# Patient Record
Sex: Male | Born: 1975 | Race: Black or African American | Hispanic: No | Marital: Single | Smoking: Never smoker
Health system: Southern US, Community
[De-identification: ages and names within clinical notes are randomized; demographics above are authoritative.]

## PROBLEM LIST (undated history)

## (undated) DIAGNOSIS — I10 Essential (primary) hypertension: Secondary | ICD-10-CM

## (undated) DIAGNOSIS — E119 Type 2 diabetes mellitus without complications: Secondary | ICD-10-CM

---

## 2017-04-18 ENCOUNTER — Emergency Department (HOSPITAL_COMMUNITY)
Admission: EM | Admit: 2017-04-18 | Discharge: 2017-04-18 | Disposition: A | Discharge status: Home / Self Care | Payer: Self-pay | Attending: Emergency Medicine | Admitting: Emergency Medicine

## 2017-04-18 ENCOUNTER — Encounter (HOSPITAL_COMMUNITY): Payer: Self-pay | Admitting: *Deleted

## 2017-04-18 DIAGNOSIS — M25512 Pain in left shoulder: Secondary | ICD-10-CM | POA: Insufficient documentation

## 2017-04-18 DIAGNOSIS — I10 Essential (primary) hypertension: Secondary | ICD-10-CM | POA: Insufficient documentation

## 2017-04-18 DIAGNOSIS — E119 Type 2 diabetes mellitus without complications: Secondary | ICD-10-CM | POA: Insufficient documentation

## 2017-04-18 HISTORY — DX: Essential (primary) hypertension: I10

## 2017-04-18 HISTORY — DX: Type 2 diabetes mellitus without complications: E11.9

## 2017-04-18 MED ORDER — TRAMADOL HCL 50 MG PO TABS: 50.0000 mg | ORAL_TABLET | Freq: Four times a day (QID) | ORAL | 0 refills | Status: DC | PRN

## 2017-04-18 MED ORDER — GABAPENTIN 100 MG PO CAPS: 100.0000 mg | ORAL_CAPSULE | Freq: Three times a day (TID) | ORAL | 0 refills | Status: DC

## 2017-04-18 NOTE — ED Notes (Signed)
Declined W/C at D/C and was escorted to lobby by RN. 

## 2017-04-18 NOTE — ED Provider Notes (Signed)
[facility]EMER[facility]ENCY DEPT Provider Note   CSN: [id number] Arrival date & time: 04/18/17  1618   By signing my name below, I, Clarisse Gouge, attest that this documentation has been prepared under the direction and in the presence of Raeford Razor, MD. Electronically signed, Clarisse Gouge, ED Scribe. 04/18/17. 6:16 PM.   History   Chief Complaint Chief Complaint  Patient presents with  . Shoulder Pain   The history is provided by the patient and medical records. No language interpreter was used.    Gregory Blake is a 41 y.o. male with h/o NIDDM presenting to the Emergency Department concerning 3 weeks of pain under the L shoulder blade that has gradually worsened since onset. He describes constant, throbbing pain that recently began aching radiating down to the L latissimus dorsi area, axilla and into the upper forearm. This pain is worse with certain movements and palpation of the arm/ shoulder at the affected areas. Pt states advil and ibuprofen have not provided relief at home. He adds consistent movement of the shoulder eases his pain momentarily. Pt's DM reportedly controlled with oral medications. Pt R handed. No other complaints at this time.   Past Medical History:  Diagnosis Date  . Diabetes mellitus without complication (HCC)   . Hypertension     There are no active problems to display for this patient.   History reviewed. No pertinent surgical history.     Home Medications    Prior to Admission medications   Not on File    Family History No family history on file.  Social History Social History  Substance Use Topics  . Smoking status: Never Smoker  . Smokeless tobacco: Never Used  . Alcohol use No     Allergies   Patient has no known allergies.   Review of Systems Review of Systems  Constitutional: Negative for chills and fever.  Gastrointestinal: Negative for nausea and vomiting.  Musculoskeletal: Positive for arthralgias and myalgias. Negative for  joint swelling.  Skin: Negative for color change and wound.  Neurological: Negative for weakness and numbness.  All other systems reviewed and are negative.    Physical Exam Updated Vital Signs BP (!) 135/94 (BP Location: Left Arm)   Pulse 92   Temp 98.1 F (36.7 C) (Oral)   Resp 16   Wt 210 lb (95.3 kg)   SpO2 96%   Physical Exam  Constitutional: He is oriented to person, place, and time. He appears well-developed and well-nourished.  HENT:  Head: Normocephalic.  Eyes: EOM are normal.  Neck: Normal range of motion.  Pulmonary/Chest: Effort normal.  Abdominal: He exhibits no distension.  Musculoskeletal: Normal range of motion. He exhibits tenderness. He exhibits no deformity.  TTP along L scapula, L axilla and medial upper arm. No swelling or skin changes. NVI.  Neurological: He is alert and oriented to person, place, and time.  Psychiatric: He has a normal mood and affect.  Nursing note and vitals reviewed.    ED Treatments / Results  DIAGNOSTIC STUDIES: Oxygen Saturation is 96% on RA, NL by my interpretation.    COORDINATION OF CARE: 6:13 PM-Discussed next steps with pt. Pt verbalized understanding and is agreeable with the plan. Will order/ Rx medications. Pt prepared for d/c, advised of symptomatic care at home, F/U instructions and return precautions.    Labs (all labs ordered are listed, but only abnormal results are displayed) Labs Reviewed - No data to display  EKG  EKG Interpretation None  Radiology No results found.  Procedures Procedures (including critical care time)  Medications Ordered in ED Medications - No data to display   Initial Impression / Assessment and Plan / ED Course  I have reviewed the triage vital signs and the nursing notes.  Pertinent labs & imaging results that were available during my care of the patient were reviewed by me and considered in my medical decision making (see chart for details).     41yM with  atraumatic L upper back and arm pain. Exam pretty unremarkable. No swelling. No skin lesions. NVI. Perhaps some mild TTP. Pain crosses several joints. Roughly follows c8 dermatome but stops at forearm. Neuropathic? Description doesn't clearly hit this though. NO neck or midline back pain.  I really doubt ACS, PE, vascular occlusion or other emergent process.   Final Clinical Impressions(s) / ED Diagnoses   Final diagnoses:  Acute pain of left shoulder    New Prescriptions New Prescriptions   No medications on file    I personally preformed the services scribed in my presence. The recorded information has been reviewed is accurate. Raeford Razor, MD.    Raeford Razor, MD 05/05/17 1125

## 2017-04-18 NOTE — ED Triage Notes (Signed)
Pt has been having pain in his left shoulder blade with pain in his left arm, pain increases when his arm is squeezed.  No CP or sob.  Not associated with any trauma

## 2017-04-25 ENCOUNTER — Emergency Department (HOSPITAL_COMMUNITY): Payer: Self-pay

## 2017-04-25 ENCOUNTER — Emergency Department (HOSPITAL_COMMUNITY)
Admission: EM | Admit: 2017-04-25 | Discharge: 2017-04-25 | Disposition: A | Discharge status: Home / Self Care | Payer: Self-pay | Attending: Emergency Medicine | Admitting: Emergency Medicine

## 2017-04-25 ENCOUNTER — Encounter (HOSPITAL_COMMUNITY): Payer: Self-pay | Admitting: Emergency Medicine

## 2017-04-25 DIAGNOSIS — Z7984 Long term (current) use of oral hypoglycemic drugs: Secondary | ICD-10-CM | POA: Insufficient documentation

## 2017-04-25 DIAGNOSIS — E119 Type 2 diabetes mellitus without complications: Secondary | ICD-10-CM | POA: Insufficient documentation

## 2017-04-25 DIAGNOSIS — Z79899 Other long term (current) drug therapy: Secondary | ICD-10-CM | POA: Insufficient documentation

## 2017-04-25 DIAGNOSIS — I10 Essential (primary) hypertension: Secondary | ICD-10-CM | POA: Insufficient documentation

## 2017-04-25 DIAGNOSIS — M549 Dorsalgia, unspecified: Secondary | ICD-10-CM | POA: Insufficient documentation

## 2017-04-25 DIAGNOSIS — M79602 Pain in left arm: Secondary | ICD-10-CM

## 2017-04-25 DIAGNOSIS — K0889 Other specified disorders of teeth and supporting structures: Secondary | ICD-10-CM | POA: Insufficient documentation

## 2017-04-25 DIAGNOSIS — M5413 Radiculopathy, cervicothoracic region: Secondary | ICD-10-CM | POA: Insufficient documentation

## 2017-04-25 LAB — CBC WITH DIFFERENTIAL/PLATELET
BASOS ABS: 0.1 10*3/uL (ref 0.0–0.1)
Basophils Relative: 1 %
EOS ABS: 0.3 10*3/uL (ref 0.0–0.7)
EOS PCT: 3 %
HCT: 46.3 % (ref 39.0–52.0)
HEMOGLOBIN: 15.8 g/dL (ref 13.0–17.0)
LYMPHS ABS: 2.6 10*3/uL (ref 0.7–4.0)
LYMPHS PCT: 26 %
MCH: 29.7 pg (ref 26.0–34.0)
MCHC: 34.1 g/dL (ref 30.0–36.0)
MCV: 87 fL (ref 78.0–100.0)
Monocytes Absolute: 1 10*3/uL (ref 0.1–1.0)
Monocytes Relative: 10 %
NEUTROS PCT: 60 %
Neutro Abs: 5.9 10*3/uL (ref 1.7–7.7)
PLATELETS: 236 10*3/uL (ref 150–400)
RBC: 5.32 MIL/uL (ref 4.22–5.81)
RDW: 13 % (ref 11.5–15.5)
WBC: 9.9 10*3/uL (ref 4.0–10.5)

## 2017-04-25 LAB — BASIC METABOLIC PANEL
Anion gap: 8 (ref 5–15)
BUN: 13 mg/dL (ref 6–20)
CHLORIDE: 101 mmol/L (ref 101–111)
CO2: 24 mmol/L (ref 22–32)
Calcium: 9.1 mg/dL (ref 8.9–10.3)
Creatinine, Ser: 0.99 mg/dL (ref 0.61–1.24)
Glucose, Bld: 266 mg/dL — ABNORMAL HIGH (ref 65–99)
POTASSIUM: 4 mmol/L (ref 3.5–5.1)
SODIUM: 133 mmol/L — AB (ref 135–145)

## 2017-04-25 LAB — CBG MONITORING, ED: GLUCOSE-CAPILLARY: 272 mg/dL — AB (ref 65–99)

## 2017-04-25 LAB — I-STAT TROPONIN, ED: TROPONIN I, POC: 0 ng/mL (ref 0.00–0.08)

## 2017-04-25 MED ORDER — NAPROXEN 500 MG PO TABS: 500.0000 mg | ORAL_TABLET | Freq: Two times a day (BID) | ORAL | 0 refills | Status: DC

## 2017-04-25 MED ORDER — OXYCODONE-ACETAMINOPHEN 5-325 MG PO TABS
1.0000 | ORAL_TABLET | Freq: Once | ORAL | Status: AC
  Administered 2017-04-25: 1 via ORAL
  Filled 2017-04-25: qty 1

## 2017-04-25 MED ORDER — CYCLOBENZAPRINE HCL 10 MG PO TABS: 10.0000 mg | ORAL_TABLET | Freq: Two times a day (BID) | ORAL | 0 refills | Status: DC | PRN

## 2017-04-25 MED ORDER — DOXYCYCLINE HYCLATE 100 MG PO CAPS: 100.0000 mg | ORAL_CAPSULE | Freq: Two times a day (BID) | ORAL | 0 refills | Status: AC

## 2017-04-25 NOTE — ED Provider Notes (Signed)
[facility] Provider Note   CSN: [id number] Arrival date & time: 04/25/17  0754     History   Chief Complaint Chief Complaint  Patient presents with  . Arm Pain    HPI Gregory Blake is a 41 y.o. male.  HPI  Patient, with a past medical history of diabetes, presents to ED for evaluation of 2 complaints. His first complaint is constant, throbbing left shoulder pain radiating down his arm for the past month. He was seen here one week ago and was told that it was nerve pain, however despite the use of gabapentin and tramadol he continues to have pain. He has also been taking aspirin. He reports pain worse with movement. He denies any injuries or falls.he is unsure if this related to a knot that he feels under his left armpit. He does have a history of abscesses in the past. His next complaint is left-sided tooth pain. He states he does have a history of broken tooth on that side and would like some antibiotics to prevent any infection. He has not seen a dentist recently. He denies any fever, vomiting, chest pain, shortness of breath, hemoptysis, prior history of MI, DVT, PE, trouble opening mouth, bleeding or drainage from site, weakness, numbness, headache. Patient states that he does take oral medications for diabetes.  Past Medical History:  Diagnosis Date  . Diabetes mellitus without complication (HCC)   . Hypertension     There are no active problems to display for this patient.   No past surgical history on file.     Home Medications    Prior to Admission medications   Medication Sig Start Date End Date Taking? Authorizing Provider  cyclobenzaprine (FLEXERIL) 10 MG tablet Take 1 tablet (10 mg total) by mouth 2 (two) times daily as needed for muscle spasms. 04/25/17   Xylah Early, PA-C  doxycycline (VIBRAMYCIN) 100 MG capsule Take 1 capsule (100 mg total) by mouth 2 (two) times daily. 04/25/17 05/02/17  Drishti Pepperman, PA-C  gabapentin (NEURONTIN) 100 MG capsule Take  1 capsule (100 mg total) by mouth 3 (three) times daily. 04/18/17   Raeford Razor, MD  naproxen (NAPROSYN) 500 MG tablet Take 1 tablet (500 mg total) by mouth 2 (two) times daily. 04/25/17   Larri Yehle, PA-C  traMADol (ULTRAM) 50 MG tablet Take 1 tablet (50 mg total) by mouth every 6 (six) hours as needed. 04/18/17   Raeford Razor, MD    Family History No family history on file.  Social History Social History  Substance Use Topics  . Smoking status: Never Smoker  . Smokeless tobacco: Never Used  . Alcohol use No     Allergies   Patient has no known allergies.   Review of Systems Review of Systems  Constitutional: Negative for chills and fever.  Respiratory: Negative for cough and shortness of breath.   Cardiovascular: Negative for chest pain and palpitations.  Gastrointestinal: Negative for nausea and vomiting.  Musculoskeletal: Positive for arthralgias, back pain and myalgias. Negative for gait problem and joint swelling.  Skin: Negative for color change and wound.  Neurological: Negative for weakness, numbness and headaches.     Physical Exam Updated Vital Signs BP (!) 143/89 (BP Location: Left Arm)   Pulse 67   Temp 98.8 F (37.1 C) (Oral)   Resp 18   SpO2 99%   Physical Exam  Constitutional: He appears well-developed and well-nourished. No distress.  HENT:  Head: Normocephalic and atraumatic.  Mouth/Throat:    TTP  over indicated gumline. Chipped teeth in area. Overall poor dentition. No facial, neck or cheek swelling noted. No pooling of secretions or trismus.  Normal voice noted with no difficulty swallowing or breathing.  Eyes: Conjunctivae and EOM are normal. No scleral icterus.  Neck: Normal range of motion.  Cardiovascular: Normal rate, regular rhythm and normal heart sounds.   Pulmonary/Chest: Effort normal and breath sounds normal. No respiratory distress.  Musculoskeletal: Normal range of motion. He exhibits tenderness. He exhibits no edema or  deformity.       Arms: Reproducible tenderness to palpation in the indicated area. Strength 5/5 in upper extremities. Equal grip strength bilaterally. Sensation intact to light touch. 2+ radial pulse.  Neurological: He is alert.  Skin: No rash noted. He is not diaphoretic.  2 cm palpable induration under left armpit with no surrounding cellulitis, streaking, color or temperature change.  Psychiatric: He has a normal mood and affect.  Nursing note and vitals reviewed.    ED Treatments / Results  Labs (all labs ordered are listed, but only abnormal results are displayed) Labs Reviewed  BASIC METABOLIC PANEL - Abnormal; Notable for the following:       Result Value   Sodium 133 (*)    Glucose, Bld 266 (*)    All other components within normal limits  CBG MONITORING, ED - Abnormal; Notable for the following:    Glucose-Capillary 272 (*)    All other components within normal limits  CBC WITH DIFFERENTIAL/PLATELET  I-STAT TROPONIN, ED    EKG  EKG Interpretation  Date/Time:  Sunday April 25 2017 09:32:26 EDT Ventricular Rate:  94 PR Interval:  180 QRS Duration: 98 QT Interval:  334 QTC Calculation: 417 R Axis:   68 Text Interpretation:  Normal sinus rhythm Possible Left atrial enlargement Borderline ECG Slight peaking of T waves Otherwise no ischemic changes Confirmed by Shaune Pollack [phone]) on 04/25/2017 10:06:39 AM Also confirmed by Shaune Pollack [phone]), editor Madalyn Rob [phone])  on 04/25/2017 10:17:38 AM       Radiology Dg Chest 2 View  Result Date: 04/25/2017 CLINICAL DATA:  Left shoulder pain. EXAM: CHEST  2 VIEW COMPARISON:  None. FINDINGS: The heart size and mediastinal contours are within normal limits. Both lungs are clear. The visualized skeletal structures are unremarkable. IMPRESSION: No active cardiopulmonary disease. Electronically Signed   By: Kennith Center M.D.   On: 04/25/2017 09:59   Dg Shoulder Left  Result Date: 04/25/2017 CLINICAL DATA:   Left shoulder pain EXAM: LEFT SHOULDER - 2+ VIEW COMPARISON:  None. FINDINGS: There is no evidence of fracture or dislocation. There is no evidence of arthropathy or other focal bone abnormality. Soft tissues are unremarkable. IMPRESSION: Negative. Electronically Signed   By: Kennith Center M.D.   On: 04/25/2017 09:54    Procedures Procedures (including critical care time)  Medications Ordered in ED Medications  oxyCODONE-acetaminophen (PERCOCET/ROXICET) 5-325 MG per tablet 1 tablet (1 tablet Oral Given 04/25/17 1031)     Initial Impression / Assessment and Plan / ED Course  I have reviewed the triage vital signs and the nursing notes.  Pertinent labs & imaging results that were available during my care of the patient were reviewed by me and considered in my medical decision making (see chart for details).     Patient presents to ED for evaluation of left back/shoulder pain radiating to arm. He was seen one week ago and reports no alleviation in symptoms with tramadol and gabapentin after being told he had  possible nerve pain. On physical exam there is tenderness to palpation on the left side of the shoulder blade. Area appears neurovascularly intact. Is equal grip strength bilaterally. He denies any injuries or trauma. Poor dentition overall but chipped tooth and area of dental pain. No trismus, drooling, neck pain. Low suspicion for Ludwig angina being the cause of his tooth pain. There is a palpable area of induration under left armpit. No fluctuance noted, no overlying cellulitis, streaking, color or temperature change and no drainage indicated at this time. He does have a history of recurrent abscesses.  Will obtain EKG, basic labs, chest x-ray and shoulder x-ray due to no improvement in pain in one week despite medication use, in order to rule out cardiac cause of this left-sided pain.  X-ray of shoulder and chest returned as negative. EKG showed no ischemic changes but did show mildly  peaked T waves. CBG 272. Troponin negative 1. CBC, BMP unremarkable, with a normal potassium. Will give patient a muscle relaxer, advised anti-inflammatories, heat therapy with instructions. Symptoms could be due to nerve pain but also could be a muscle strain. We'll refer to ortho for further evaluation. We'll give doxycycline to cover for abscess under her armpit and tooth pain considering that patient does have a history of abscesses in the past.Patient appears stable for discharge at this time. Strict return precautions given.  Patient discussed with Dr. Erma Heritage.  Final Clinical Impressions(s) / ED Diagnoses   Final diagnoses:  Radiculopathy of cervicothoracic region  Left arm pain  Pain, dental    New Prescriptions New Prescriptions   CYCLOBENZAPRINE (FLEXERIL) 10 MG TABLET    Take 1 tablet (10 mg total) by mouth 2 (two) times daily as needed for muscle spasms.   DOXYCYCLINE (VIBRAMYCIN) 100 MG CAPSULE    Take 1 capsule (100 mg total) by mouth 2 (two) times daily.   NAPROXEN (NAPROSYN) 500 MG TABLET    Take 1 tablet (500 mg total) by mouth 2 (two) times daily.     Dietrich Pates, PA-C 04/25/17 1125    Shaune Pollack, MD 04/26/17 7846    Shaune Pollack, MD 04/26/17 [phone]

## 2017-04-25 NOTE — ED Triage Notes (Addendum)
Pt reports left shoulder pain radiating into left arm, sharp shooting pain. Pt seen for this 8/19. Pt states pain is shooting and was told it was nerve pain. Pain is reproducible. Pt states he feels a knot in his arm pit. He has been taking asprin for the pain but pain is getting worse. Also has tooth pain.

## 2017-04-25 NOTE — Discharge Instructions (Signed)
Please read attached information regarding your condition, dental resource guide and neuropathic pain. Take doxycycline twice daily for 1 week. Wear sling as needed. Take Flexeril and naproxen as needed for pain and inflammation. Follow-up with orthopedist list below for further evaluation. Follow up with dentist using dental resource guide and information given below. Return to ED for worsening pain, trouble breathing, trouble swallowing, chest pain, coughing up blood, leg swelling, numbness.

## 2017-04-25 NOTE — ED Notes (Signed)
Pt reports "no relief yet".

## 2017-04-25 NOTE — ED Notes (Signed)
EKG shown to Dr. Erma Heritage.

## 2017-04-25 NOTE — ED Notes (Signed)
Returned from xray

## 2017-04-25 NOTE — ED Notes (Signed)
Pt appears very uncomfortable. Requesting something for pain.

## 2017-04-25 NOTE — ED Notes (Signed)
Pt here for ongoing left arm pain. Was seen here 9/19, given "a nerve ill" and Tramadol. Pt states "ya'll were just guessing last time, I want to know what it is this time." Pain is constant, radiates down left arm and into axilla. Denies numbness, tingling or weakness of left arm. Now also reports left upper dental pain. Reports he sometimes can get relief by raising his left arm above his head. Sx began 1 month ago. No known injury.

## 2017-04-25 NOTE — ED Notes (Signed)
Patient transported to X-ray 

## 2017-05-03 ENCOUNTER — Encounter (HOSPITAL_COMMUNITY): Payer: Self-pay | Admitting: Emergency Medicine

## 2017-05-03 ENCOUNTER — Emergency Department (HOSPITAL_COMMUNITY): Payer: Self-pay

## 2017-05-03 ENCOUNTER — Emergency Department (HOSPITAL_COMMUNITY)
Admission: EM | Admit: 2017-05-03 | Discharge: 2017-05-03 | Disposition: A | Discharge status: Home / Self Care | Payer: Self-pay | Attending: Emergency Medicine | Admitting: Emergency Medicine

## 2017-05-03 DIAGNOSIS — M25512 Pain in left shoulder: Secondary | ICD-10-CM | POA: Insufficient documentation

## 2017-05-03 DIAGNOSIS — M79602 Pain in left arm: Secondary | ICD-10-CM | POA: Insufficient documentation

## 2017-05-03 DIAGNOSIS — Z79899 Other long term (current) drug therapy: Secondary | ICD-10-CM | POA: Insufficient documentation

## 2017-05-03 DIAGNOSIS — I10 Essential (primary) hypertension: Secondary | ICD-10-CM | POA: Insufficient documentation

## 2017-05-03 DIAGNOSIS — M5412 Radiculopathy, cervical region: Secondary | ICD-10-CM | POA: Insufficient documentation

## 2017-05-03 DIAGNOSIS — E119 Type 2 diabetes mellitus without complications: Secondary | ICD-10-CM | POA: Insufficient documentation

## 2017-05-03 LAB — BASIC METABOLIC PANEL
ANION GAP: 9 (ref 5–15)
BUN: 19 mg/dL (ref 6–20)
CO2: 24 mmol/L (ref 22–32)
Calcium: 9.4 mg/dL (ref 8.9–10.3)
Chloride: 103 mmol/L (ref 101–111)
Creatinine, Ser: 1.01 mg/dL (ref 0.61–1.24)
Glucose, Bld: 279 mg/dL — ABNORMAL HIGH (ref 65–99)
POTASSIUM: 3.7 mmol/L (ref 3.5–5.1)
SODIUM: 136 mmol/L (ref 135–145)

## 2017-05-03 LAB — CBC
HEMATOCRIT: 47.9 % (ref 39.0–52.0)
HEMOGLOBIN: 16.6 g/dL (ref 13.0–17.0)
MCH: 29.5 pg (ref 26.0–34.0)
MCHC: 34.7 g/dL (ref 30.0–36.0)
MCV: 85.2 fL (ref 78.0–100.0)
Platelets: 247 10*3/uL (ref 150–400)
RBC: 5.62 MIL/uL (ref 4.22–5.81)
RDW: 12.8 % (ref 11.5–15.5)
WBC: 10.4 10*3/uL (ref 4.0–10.5)

## 2017-05-03 LAB — I-STAT TROPONIN, ED: Troponin i, poc: 0 ng/mL (ref 0.00–0.08)

## 2017-05-03 MED ORDER — HYDROCODONE-ACETAMINOPHEN 5-325 MG PO TABS: 1.0000 | ORAL_TABLET | ORAL | 0 refills | Status: DC | PRN

## 2017-05-03 MED ORDER — GLIPIZIDE 5 MG PO TABS: 5.0000 mg | ORAL_TABLET | Freq: Every day | ORAL | 3 refills | Status: DC

## 2017-05-03 MED ORDER — METFORMIN HCL 500 MG PO TABS: 500.0000 mg | ORAL_TABLET | Freq: Two times a day (BID) | ORAL | 0 refills | Status: DC

## 2017-05-03 NOTE — ED Provider Notes (Signed)
[facility] Provider Note   CSN: [id number] Arrival date & time: 05/03/17  0343     History   Chief Complaint Chief Complaint  Patient presents with  . Chest Pain  . Neck Pain    HPI Gregory Blake is a 41 y.o. male.  Patient presents to the ER for evaluation of persistent left arm and shoulder pain, now with chest pain. Patient reports that he has been experiencing pain in the area of the left neck, left shoulder and arm for more than a month. He has been seen in the ER twice for this. He is now experiencing pain in the upper chest as well. He is not short of breath. All of the pain worsens with movement of the neck. He has not had any weakness of the upper extremities. He denies any trauma. He has been provided multiple medications in the ER without relief.      Past Medical History:  Diagnosis Date  . Diabetes mellitus without complication (HCC)   . Hypertension     There are no active problems to display for this patient.   History reviewed. No pertinent surgical history.     Home Medications    Prior to Admission medications   Medication Sig Start Date End Date Taking? Authorizing Provider  cyclobenzaprine (FLEXERIL) 10 MG tablet Take 1 tablet (10 mg total) by mouth 2 (two) times daily as needed for muscle spasms. 04/25/17   Khatri, Hina, PA-C  gabapentin (NEURONTIN) 100 MG capsule Take 1 capsule (100 mg total) by mouth 3 (three) times daily. 04/18/17   Raeford Razor, MD  naproxen (NAPROSYN) 500 MG tablet Take 1 tablet (500 mg total) by mouth 2 (two) times daily. 04/25/17   Khatri, Hina, PA-C  traMADol (ULTRAM) 50 MG tablet Take 1 tablet (50 mg total) by mouth every 6 (six) hours as needed. 04/18/17   Raeford Razor, MD    Family History No family history on file.  Social History Social History  Substance Use Topics  . Smoking status: Never Smoker  . Smokeless tobacco: Never Used  . Alcohol use No     Allergies   Iodine   Review of  Systems Review of Systems  Cardiovascular: Positive for chest pain.  Musculoskeletal: Positive for neck pain.  Neurological: Negative for weakness and numbness.  All other systems reviewed and are negative.    Physical Exam Updated Vital Signs BP (!) 132/91   Pulse (!) 107   Temp 98.4 F (36.9 C) (Oral)   Resp 15   Ht 6\' 1"  (1.854 m)   Wt 95.3 kg (210 lb)   SpO2 99%   BMI 27.71 kg/m   Physical Exam  Constitutional: He is oriented to person, place, and time. He appears well-developed and well-nourished. No distress.  HENT:  Head: Normocephalic and atraumatic.  Right Ear: Hearing normal.  Left Ear: Hearing normal.  Nose: Nose normal.  Mouth/Throat: Oropharynx is clear and moist and mucous membranes are normal.  Eyes: Pupils are equal, round, and reactive to light. Conjunctivae and EOM are normal.  Neck: Normal range of motion. Neck supple. Muscular tenderness present.  Increased pain with turning head side-to-side  Cardiovascular: Regular rhythm, S1 normal and S2 normal.  Exam reveals no gallop and no friction rub.   No murmur heard. Pulmonary/Chest: Effort normal and breath sounds normal. No respiratory distress. He exhibits no tenderness.  Abdominal: Soft. Normal appearance and bowel sounds are normal. There is no hepatosplenomegaly. There is no tenderness. There is  no rebound, no guarding, no tenderness at McBurney's point and negative Murphy's sign. No hernia.  Musculoskeletal: Normal range of motion.  Neurological: He is alert and oriented to person, place, and time. He has normal strength. No cranial nerve deficit or sensory deficit. Coordination normal. GCS eye subscore is 4. GCS verbal subscore is 5. GCS motor subscore is 6.  Skin: Skin is warm, dry and intact. No rash noted. No cyanosis.  Psychiatric: He has a normal mood and affect. His speech is normal and behavior is normal. Thought content normal.  Nursing note and vitals reviewed.    ED Treatments / Results   Labs (all labs ordered are listed, but only abnormal results are displayed) Labs Reviewed  BASIC METABOLIC PANEL - Abnormal; Notable for the following:       Result Value   Glucose, Bld 279 (*)    All other components within normal limits  CBC  I-STAT TROPONIN, ED  CBG MONITORING, ED    EKG  EKG Interpretation  Date/Time:  Monday May 03 2017 03:58:05 EDT Ventricular Rate:  106 PR Interval:  180 QRS Duration: 92 QT Interval:  326 QTC Calculation: 433 R Axis:   68 Text Interpretation:  Sinus tachycardia Possible Left atrial enlargement Septal infarct , age undetermined Abnormal ECG No significant change since last tracing Confirmed by Gilda Crease [phone]) on 05/03/2017 5:55:27 AM       Radiology Dg Chest 2 View  Result Date: 05/03/2017 CLINICAL DATA:  Intermittent chest pain radiating to the shoulder. EXAM: CHEST  2 VIEW COMPARISON:  04/25/2017 FINDINGS: The lungs are clear. The pulmonary vasculature is normal. Heart size is normal. Hilar and mediastinal contours are unremarkable. There is no pleural effusion. IMPRESSION: No active cardiopulmonary disease. Electronically Signed   By: Ellery Plunk M.D.   On: 05/03/2017 04:25   Ct Cervical Spine Wo Contrast  Result Date: 05/03/2017 CLINICAL DATA:  41 y/o M; neck pain radiating to the left shoulder and upper extremity. Initial exam. EXAM: CT CERVICAL SPINE WITHOUT CONTRAST TECHNIQUE: Multidetector CT imaging of the cervical spine was performed without intravenous contrast. Multiplanar CT image reconstructions were also generated. COMPARISON:  None. FINDINGS: Alignment: Mild reversal of cervical curvature with apex at C5. No listhesis. Skull base and vertebrae: No acute fracture. No primary bone lesion or focal pathologic process. Soft tissues and spinal canal: No prevertebral fluid or swelling. No visible canal hematoma. Disc levels: Mild cervical spondylosis with multilevel discogenic degenerative changes with mild  loss of disc space height at the C5-6 and C6-7 levels. Small anterior marginal osteophytes are present at C3-C6. Uncovertebral hypertrophy encroaches on the neural foramen bilaterally from C3-4 through C6-7 in greatest at C6-7. C4-5 small right central disc protrusion and C5-6 left central disc protrusions efface the ventral thecal sac and may contact the anterior cord. Osteophytosis of the anterior C1-2 articulation. Upper chest: Negative. Other: None. IMPRESSION: 1. Mild reversal of cervical curvature. No listhesis. No acute fracture. 2. 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 3. Uncovertebral hypertrophy encroaches on the neural foramen bilaterally at C3-4 through C6-7 greatest at the C6-7 level. 4. Small central disc protrusions at C4-5 and C5-6 efface the ventral thecal sac and may contact the anterior cord. Electronically Signed   By: Mitzi Hansen M.D.   On: 05/03/2017 06:45    Procedures Procedures (including critical care time)  Medications Ordered in ED Medications - No data to display   Initial Impression / Assessment and Plan / ED Course  I have reviewed the triage vital signs and the nursing notes.  Pertinent labs & imaging results that were available during my care of the patient were reviewed by me and considered in my medical decision making (see chart for details).     Patient presents to the ER for evaluation of continued pain. He has been seen 2 previous times for pain in the area of the left neck, left shoulder and arm. Pain had been radiating to the left shoulder blade previously. He now appears to be experiencing pain radiating to the upper chest. Pain is reproducible with movement. EKG does not suggest ischemia or infarct. Troponin negative.  CT neck performed. There are bulging disks and degenerative changes that would explain his symptoms. Can refer to neurosurgery for outpatient evaluation to determine if the could benefit  from surgical intervention. Will treat with analgesia. Unfortunately, patient is a diabetic and does have some slightly elevated blood sugars, will hold steroids at this time.  Final Clinical Impressions(s) / ED Diagnoses   Final diagnoses:  Cervical radiculopathy    New Prescriptions New Prescriptions   No medications on file     Gilda CreasePollina, Ned Kakar J, MD 05/03/17 [phone]

## 2017-05-03 NOTE — ED Triage Notes (Signed)
Reports chest pain that has been on and off for over a week and neck pain that goes down into left shoulder that has been going on for a month and a half.  Unaware of any injury.

## 2017-05-04 LAB — CBG MONITORING, ED: GLUCOSE-CAPILLARY: 196 mg/dL — AB (ref 65–99)

## 2017-06-19 ENCOUNTER — Encounter (HOSPITAL_COMMUNITY): Payer: Self-pay | Admitting: Emergency Medicine

## 2017-06-19 ENCOUNTER — Ambulatory Visit (HOSPITAL_COMMUNITY)
Admission: EM | Admit: 2017-06-19 | Discharge: 2017-06-19 | Disposition: A | Discharge status: Home / Self Care | Payer: Self-pay | Attending: Family Medicine | Admitting: Family Medicine

## 2017-06-19 DIAGNOSIS — R739 Hyperglycemia, unspecified: Secondary | ICD-10-CM

## 2017-06-19 LAB — GLUCOSE, CAPILLARY: GLUCOSE-CAPILLARY: 420 mg/dL — AB (ref 65–99)

## 2017-06-19 MED ORDER — GLIPIZIDE 5 MG PO TABS: 5.0000 mg | ORAL_TABLET | Freq: Every day | ORAL | 3 refills | Status: DC

## 2017-06-19 MED ORDER — METFORMIN HCL 500 MG PO TABS: 500.0000 mg | ORAL_TABLET | Freq: Two times a day (BID) | ORAL | 3 refills | Status: DC

## 2017-06-19 NOTE — ED Triage Notes (Signed)
Patient says he has been out of medicines for 2 weeks and has no pcp.  Patient says he has had headaches and frequent urination for 2 daysand concerned for what readings are at this time.

## 2017-06-19 NOTE — ED Provider Notes (Signed)
  [facility]   [id number] 06/19/17 Arrival Time: 1219  ASSESSMENT & PLAN:  1. Hyperglycemia     Meds ordered this encounter  Medications  . metFORMIN (GLUCOPHAGE) 500 MG tablet    Sig: Take 1 tablet (500 mg total) by mouth 2 (two) times daily with a meal.    Dispense:  60 tablet    Refill:  3  . glipiZIDE (GLUCOTROL) 5 MG tablet    Sig: Take 1 tablet (5 mg total) by mouth daily before breakfast.    Dispense:  30 tablet    Refill:  3   Will resume above medications today. Plans to est with PCP; information given. May f/u here in 48-72 hours for repeat BS. Reviewed expectations re: course of current medical issues. Questions answered. Outlined signs and symptoms indicating need for more acute intervention. Patient verbalized understanding. After Visit Summary given.   SUBJECTIVE:  Gregory Blake is a 41 y.o. male who presents with complaint of "high blood sugar." Has been out of diabetic medications for approximately 2 weeks. Reports occasional headaches (none currently). No visual changes. Does have frequent urination. Ambulatory without problem. No abdominal pain.  ROS: As per HPI.   OBJECTIVE:  Vitals:   06/19/17 1324  BP: 133/83  Pulse: 83  Resp: 18  Temp: 98.2 F (36.8 C)  TempSrc: Oral  SpO2: 97%    General appearance: alert; no distress Eyes: PERRLA; EOMI; conjunctiva normal HENT: normocephalic; atraumatic; oral mucosa normal Neck: supple Lungs: clear to auscultation bilaterally Heart: regular rate and rhythm Abdomen: soft, non-tender; bowel sounds normal Back: no CVA tenderness Extremities: no cyanosis or edema; symmetrical with no gross deformities Skin: warm and dry Neurologic: normal gait; normal symmetric reflexes Psychological: alert and cooperative; normal mood and affect  Labs: Results for orders placed or performed during the hospital encounter of 06/19/17  Glucose, capillary  Result Value Ref Range   Glucose-Capillary 420 (H)  65 - 99 mg/dL   Labs Reviewed  GLUCOSE, CAPILLARY - Abnormal; Notable for the following:       Result Value   Glucose-Capillary 420 (*)    All other components within normal limits    Imaging: No results found.  Allergies  Allergen Reactions  . Iodine Anaphylaxis    Past Medical History:  Diagnosis Date  . Diabetes mellitus without complication (HCC)   . Hypertension    Social History   Social History  . Marital status: Single    Spouse name: N/A  . Number of children: N/A  . Years of education: N/A   Occupational History  . Not on file.   Social History Main Topics  . Smoking status: Never Smoker  . Smokeless tobacco: Never Used  . Alcohol use No  . Drug use: No  . Sexual activity: Not on file   Other Topics Concern  . Not on file   Social History Narrative  . No narrative on file   Family History  Problem Relation Age of Onset  . Diabetes Mother   . Hypertension Mother       Mardella LaymanHagler, Kanoelani Dobies, MD 06/19/17 [phone]

## 2017-09-13 ENCOUNTER — Ambulatory Visit (HOSPITAL_COMMUNITY)
Admission: EM | Admit: 2017-09-13 | Discharge: 2017-09-13 | Disposition: A | Discharge status: Home / Self Care | Payer: Self-pay | Attending: Family Medicine | Admitting: Family Medicine

## 2017-09-13 ENCOUNTER — Encounter (HOSPITAL_COMMUNITY): Payer: Self-pay | Admitting: Emergency Medicine

## 2017-09-13 ENCOUNTER — Other Ambulatory Visit: Payer: Self-pay

## 2017-09-13 DIAGNOSIS — M542 Cervicalgia: Secondary | ICD-10-CM

## 2017-09-13 DIAGNOSIS — IMO0001 Reserved for inherently not codable concepts without codable children: Secondary | ICD-10-CM

## 2017-09-13 DIAGNOSIS — E1165 Type 2 diabetes mellitus with hyperglycemia: Secondary | ICD-10-CM

## 2017-09-13 DIAGNOSIS — I10 Essential (primary) hypertension: Secondary | ICD-10-CM

## 2017-09-13 LAB — GLUCOSE, CAPILLARY: Glucose-Capillary: 413 mg/dL — ABNORMAL HIGH (ref 65–99)

## 2017-09-13 MED ORDER — METFORMIN HCL 500 MG PO TABS: 500.0000 mg | ORAL_TABLET | Freq: Two times a day (BID) | ORAL | 3 refills | Status: DC

## 2017-09-13 MED ORDER — GABAPENTIN 100 MG PO CAPS: 100.0000 mg | ORAL_CAPSULE | Freq: Three times a day (TID) | ORAL | 2 refills | Status: DC

## 2017-09-13 MED ORDER — GLIPIZIDE 5 MG PO TABS: 5.0000 mg | ORAL_TABLET | Freq: Every day | ORAL | 3 refills | Status: DC

## 2017-09-13 NOTE — ED Triage Notes (Signed)
Pt needs refills of his diabetes medicines. Metformin and glipizide. Pt denies symptoms at this time. Last took his meds three days ago.

## 2017-09-13 NOTE — ED Provider Notes (Addendum)
[facility]   [id number] 09/13/17 Arrival Time: 1455   SUBJECTIVE:  Gregory Blake is a 42 y.o. male who presents to the urgent care with complaint of needs refills of his diabetes medicines. Metformin and glipizide. Pt denies symptoms at this time. Last took his meds three days ago.  Patient has not been taking his antihypertensives.  He smoked a cigarette just before arrival.  Also wants pain medicine for his neck where he has chronic neck problems.  He states aspirin and tramadol don't help.  Not exercising.  No primary care doctor.  Unemployed.  Not checking sugars.   Past Medical History:  Diagnosis Date  . Diabetes mellitus without complication (HCC)   . Hypertension    Family History  Problem Relation Age of Onset  . Diabetes Mother   . Hypertension Mother    Social History   Socioeconomic History  . Marital status: Single    Spouse name: Not on file  . Number of children: Not on file  . Years of education: Not on file  . Highest education level: Not on file  Social Needs  . Financial resource strain: Not on file  . Food insecurity - worry: Not on file  . Food insecurity - inability: Not on file  . Transportation needs - medical: Not on file  . Transportation needs - non-medical: Not on file  Occupational History  . Not on file  Tobacco Use  . Smoking status: Never Smoker  . Smokeless tobacco: Never Used  Substance and Sexual Activity  . Alcohol use: No  . Drug use: No  . Sexual activity: Not on file  Other Topics Concern  . Not on file  Social History Narrative  . Not on file   No outpatient medications have been marked as taking for the 09/13/17 encounter [facility](Hospital Encounter).   Allergies  Allergen Reactions  . Iodine Anaphylaxis      ROS: As per HPI, remainder of ROS negative.   OBJECTIVE:   Vitals:   09/13/17 1525  BP: (!) 148/97  Pulse: 91  Resp: 18  Temp: 98.3 F (36.8 C)  SpO2: 100%     General appearance: alert;  no distress Eyes: PERRL; EOMI; conjunctiva normal HENT: normocephalic; atraumatic;  nasal mucosa normal; oral mucosa normal Neck: supple; full ROM Lungs: clear to auscultation bilaterally Heart: regular rate and rhythm Back: no CVA tenderness Extremities: no cyanosis or edema; symmetrical with no gross deformities Skin: warm and dry; feet unremarkable Neurologic: normal gait; grossly normal Psychological: alert and cooperative; normal mood and affect      Labs:  Results for orders placed or performed during the hospital encounter of 09/13/17  Glucose, capillary  Result Value Ref Range   Glucose-Capillary 413 (H) 65 - 99 mg/dL    Labs Reviewed  GLUCOSE, CAPILLARY - Abnormal; Notable for the following components:      Result Value   Glucose-Capillary 413 (*)    All other components within normal limits    No results found.     ASSESSMENT & PLAN:  1. Uncontrolled diabetes mellitus type 2 without complications (HCC)   2. Essential hypertension   3. Neck pain     Meds ordered this encounter  Medications  . gabapentin (NEURONTIN) 100 MG capsule    Sig: Take 1 capsule (100 mg total) by mouth 3 (three) times daily.    Dispense:  90 capsule    Refill:  2  . glipiZIDE (GLUCOTROL) 5 MG tablet  Sig: Take 1 tablet (5 mg total) by mouth daily before breakfast.    Dispense:  30 tablet    Refill:  3  . metFORMIN (GLUCOPHAGE) 500 MG tablet    Sig: Take 1 tablet (500 mg total) by mouth 2 (two) times daily with a meal.    Dispense:  60 tablet    Refill:  3  You need to find a primary care doctor to help you manage your diabetes and high blood pressure.  As we discussed, it is very important to stop smoking to protect your blood vessels that supply your brain, your heart, and your other organs.  Take the gabapentin for the neck pain.  Your blood sugar today is very high.  This can lead to kidney, pancreas, and blood vessel injury.  Restart your exercise program.  Our  social worker Seward Grater has helped you get an orange card and find a primary care doctor by calling the help number in this discharge packet.   Reviewed expectations re: course of current medical issues. Questions answered. Outlined signs and symptoms indicating need for more acute intervention. Patient verbalized understanding. After Visit Summary given.    Procedures:      Elvina Sidle, MD 09/13/17 1637    Elvina Sidle, MD 09/13/17 [phone]

## 2017-09-13 NOTE — Discharge Instructions (Signed)
You need to find a primary care doctor to help you manage your diabetes and high blood pressure.  As we discussed, it is very important to stop smoking to protect your blood vessels that supply your brain, your heart, and your other organs.  Take the gabapentin for the neck pain.  Your blood sugar today is very high.  This can lead to kidney, pancreas, and blood vessel injury.  Restart your exercise program.  Our social worker Seward GraterMaggie has helped you get an orange card and find a primary care doctor by calling the help number in this discharge packet.

## 2017-09-21 ENCOUNTER — Encounter (HOSPITAL_COMMUNITY): Payer: Self-pay | Admitting: Family Medicine

## 2017-09-21 ENCOUNTER — Other Ambulatory Visit: Payer: Self-pay

## 2017-09-21 ENCOUNTER — Ambulatory Visit (HOSPITAL_COMMUNITY)
Admission: EM | Admit: 2017-09-21 | Discharge: 2017-09-21 | Disposition: A | Discharge status: Home / Self Care | Payer: Self-pay | Attending: Family Medicine | Admitting: Family Medicine

## 2017-09-21 DIAGNOSIS — J40 Bronchitis, not specified as acute or chronic: Secondary | ICD-10-CM

## 2017-09-21 LAB — GLUCOSE, CAPILLARY: Glucose-Capillary: 370 mg/dL — ABNORMAL HIGH (ref 65–99)

## 2017-09-21 MED ORDER — ALBUTEROL SULFATE HFA 108 (90 BASE) MCG/ACT IN AERS: 2.0000 | INHALATION_SPRAY | RESPIRATORY_TRACT | 1 refills | Status: AC | PRN

## 2017-09-21 MED ORDER — AZITHROMYCIN 250 MG PO TABS: 250.0000 mg | ORAL_TABLET | Freq: Every day | ORAL | 0 refills | Status: DC

## 2017-09-21 NOTE — Discharge Instructions (Signed)
Your blood sugar is better than a week ago but still too elevated.    Follow up with a primary care provider.

## 2017-09-21 NOTE — ED Provider Notes (Signed)
[facility]   [id number] 09/21/17 Arrival Time: 1436   SUBJECTIVE:  Gregory Blake is a 42 y.o. male who presents to the urgent care with complaint of flu symptoms:  Cough, chest congestion and sore throat for 4 days.  He has been feeling some substernal heaviness associated with the cough.  Continues to smoke.  PMHx:  Uncontrolled diabetes.  Taking diabetes meds.  Also has ache in left upper arm (noted on last visit as well--> unchanged.)   Past Medical History:  Diagnosis Date  . Diabetes mellitus without complication (HCC)   . Hypertension    Family History  Problem Relation Age of Onset  . Diabetes Mother   . Hypertension Mother    Social History   Socioeconomic History  . Marital status: Single    Spouse name: Not on file  . Number of children: Not on file  . Years of education: Not on file  . Highest education level: Not on file  Social Needs  . Financial resource strain: Not on file  . Food insecurity - worry: Not on file  . Food insecurity - inability: Not on file  . Transportation needs - medical: Not on file  . Transportation needs - non-medical: Not on file  Occupational History  . Not on file  Tobacco Use  . Smoking status: Never Smoker  . Smokeless tobacco: Never Used  Substance and Sexual Activity  . Alcohol use: No  . Drug use: No  . Sexual activity: Not on file  Other Topics Concern  . Not on file  Social History Narrative  . Not on file   No outpatient medications have been marked as taking for the 09/21/17 encounter [facility](Hospital Encounter).   Allergies  Allergen Reactions  . Iodine Anaphylaxis      ROS: As per HPI, remainder of ROS negative.   OBJECTIVE:   Vitals:   09/21/17 1456  BP: (!) 156/93  Pulse: 85  Resp: 18  Temp: 98.5 F (36.9 C)  TempSrc: Oral  SpO2: 100%     General appearance: alert; no distress Eyes: PERRL; EOMI; conjunctiva normal HENT: normocephalic; atraumatic; TMs normal, canal normal, external  ears normal without trauma; nasal mucosa normal; oral mucosa normal Neck: supple Lungs:  few insp rales and expiratory wheezes bilaterally Heart: regular rate and rhythm Back: no CVA tenderness Extremities: no cyanosis or edema; symmetrical with no gross deformities Skin: warm and dry Neurologic: normal gait; grossly normal Psychological: alert and cooperative; normal mood and affect      Labs:  Results for orders placed or performed during the hospital encounter of 09/21/17  Glucose, capillary  Result Value Ref Range   Glucose-Capillary 370 (H) 65 - 99 mg/dL    Labs Reviewed  GLUCOSE, CAPILLARY - Abnormal; Notable for the following components:      Result Value   Glucose-Capillary 370 (*)    All other components within normal limits    No results found.     ASSESSMENT & PLAN:  1. Bronchitis     Meds ordered this encounter  Medications  . azithromycin (ZITHROMAX) 250 MG tablet    Sig: Take 1 tablet (250 mg total) by mouth daily. Take first 2 tablets together, then 1 every day until finished.    Dispense:  6 tablet    Refill:  0  . albuterol (PROVENTIL HFA;VENTOLIN HFA) 108 (90 Base) MCG/ACT inhaler    Sig: Inhale 2 puffs into the lungs every 4 (four) hours as needed for wheezing or  shortness of breath (cough, shortness of breath or wheezing.).    Dispense:  1 Inhaler    Refill:  1    Reviewed expectations re: course of current medical issues. Questions answered. Outlined signs and symptoms indicating need for more acute intervention. Patient verbalized understanding. After Visit Summary given.    Procedures:      Elvina Sidle, MD 09/21/17 1521

## 2017-09-21 NOTE — ED Triage Notes (Signed)
Pt c/o cold symptoms, cough, chest congestion. Sore throat. x4 days.

## 2017-11-14 ENCOUNTER — Ambulatory Visit (HOSPITAL_COMMUNITY)
Admission: EM | Admit: 2017-11-14 | Discharge: 2017-11-14 | Disposition: A | Discharge status: Home / Self Care | Payer: Self-pay | Attending: Physician Assistant | Admitting: Physician Assistant

## 2017-11-14 ENCOUNTER — Encounter (HOSPITAL_COMMUNITY): Payer: Self-pay | Admitting: *Deleted

## 2017-11-14 DIAGNOSIS — E119 Type 2 diabetes mellitus without complications: Secondary | ICD-10-CM | POA: Insufficient documentation

## 2017-11-14 DIAGNOSIS — R361 Hematospermia: Secondary | ICD-10-CM

## 2017-11-14 DIAGNOSIS — I10 Essential (primary) hypertension: Secondary | ICD-10-CM | POA: Insufficient documentation

## 2017-11-14 LAB — PSA: PROSTATIC SPECIFIC ANTIGEN: 0.65 ng/mL (ref 0.00–4.00)

## 2017-11-14 MED ORDER — AMLODIPINE BESYLATE 10 MG PO TABS: 10.0000 mg | ORAL_TABLET | Freq: Every day | ORAL | 0 refills | Status: AC

## 2017-11-14 NOTE — ED Provider Notes (Signed)
11/14/2017 3:12 PM   DOB: 12/08/1975 / MRN: [id number]  SUBJECTIVE:  Gregory Blake is a 42 y.o. male presenting for hematospermia that started about 24 hours ago.  Since that time he is noticed some "pain and swelling" in the right testicle.  He denies fever, new back pain, perineal pain, nausea, dysuria, urgency, frequency.  He denies blood in the urine.  He has not taking his blood pressure medication at this time.  He is allergic to iodine.   He  has a past medical history of Diabetes mellitus without complication (HCC) and Hypertension.    He  reports that  has never smoked. he has never used smokeless tobacco. He reports that he does not drink alcohol or use drugs. He  has no sexual activity history on file. The patient  has no past surgical history on file.  His family history includes Diabetes in his mother; Hypertension in his mother.  Review of Systems  Constitutional: Negative for chills, diaphoresis and fever.  Respiratory: Negative for shortness of breath.   Cardiovascular: Negative for chest pain, orthopnea and leg swelling.  Gastrointestinal: Negative for nausea.  Skin: Negative for rash.  Neurological: Negative for dizziness.    OBJECTIVE:  BP (!) 160/79   Pulse 79   Temp 98 F (36.7 C) (Oral)   Resp 16   SpO2 100%   Physical Exam  Constitutional: He appears well-developed. He is active and cooperative.  Non-toxic appearance.  Cardiovascular: Normal rate, regular rhythm, S1 normal, S2 normal, normal heart sounds, intact distal pulses and normal pulses. Exam reveals no gallop and no friction rub.  No murmur heard. Pulmonary/Chest: Effort normal. No stridor. No tachypnea. No respiratory distress. He has no wheezes. He has no rales.  Abdominal: He exhibits no distension. Hernia confirmed negative in the right inguinal area and confirmed negative in the left inguinal area.  Genitourinary: Penis normal. Right testis shows no mass, no swelling and no tenderness. Right  testis is descended. Cremasteric reflex is not absent on the right side. Left testis shows no mass, no swelling and no tenderness. Left testis is descended. Cremasteric reflex is not absent on the left side. No penile tenderness.  Musculoskeletal: He exhibits no edema.  Lymphadenopathy:       Right: No inguinal adenopathy present.       Left: No inguinal adenopathy present.  Neurological: He is alert.  Skin: Skin is warm and dry. He is not diaphoretic. No pallor.  Vitals reviewed.   Lab Results  Component Value Date   CREATININE 1.01 05/03/2017   BUN 19 05/03/2017   NA 136 05/03/2017   K 3.7 05/03/2017   CL 103 05/03/2017   CO2 24 05/03/2017  ;  No results found for this or any previous visit (from the past 72 hour(s)).  No results found.  ASSESSMENT AND PLAN:  Orders Placed This Encounter  Procedures  . HIV antibody    Standing Status:   Standing    Number of Occurrences:   1  . RPR    Standing Status:   Standing    Number of Occurrences:   1  . PSA    Standing Status:   Standing    Number of Occurrences:   1     Hematospermia: I see no red flags on his exam.  There is no swelling and no tenderness about the right testicle patient is, right epididymitis, there is no hernia.  I think he saw some blood in his sperm and  became worried and is now having somatic testicle pain.  I will check him for STI however he says he always uses condoms.      The patient is advised to call or return to clinic if he does not see an improvement in symptoms, or to seek the care of the closest emergency department if he worsens with the above plan.   Deliah Boston, MHS, PA-C 11/14/2017 3:12 PM    Ofilia Neas, PA-C 11/14/17 1514

## 2017-11-14 NOTE — Discharge Instructions (Addendum)
Your exam is very reassuring today.  I would check you for STDs.  It would be smart to get established with a primary care.  Please take your blood pressure and diabetes medications as prescribed.

## 2017-11-14 NOTE — ED Triage Notes (Signed)
Patient reports yesterday he developed right sided lower groin pain, feels like his right testicle is swollen. Pain is described as discomfort-not sharp. Pain does not radiate.

## 2017-11-15 LAB — URINE CYTOLOGY ANCILLARY ONLY
Chlamydia: NEGATIVE
Neisseria Gonorrhea: NEGATIVE
Trichomonas: NEGATIVE

## 2017-11-15 LAB — RPR: RPR: NONREACTIVE

## 2017-11-16 ENCOUNTER — Other Ambulatory Visit: Payer: Self-pay

## 2017-11-16 ENCOUNTER — Encounter (HOSPITAL_COMMUNITY): Payer: Self-pay | Admitting: *Deleted

## 2017-11-16 ENCOUNTER — Emergency Department (HOSPITAL_COMMUNITY)
Admission: EM | Admit: 2017-11-16 | Discharge: 2017-11-17 | Disposition: A | Discharge status: Home / Self Care | Payer: Self-pay | Attending: Emergency Medicine | Admitting: Emergency Medicine

## 2017-11-16 DIAGNOSIS — Z79899 Other long term (current) drug therapy: Secondary | ICD-10-CM | POA: Insufficient documentation

## 2017-11-16 DIAGNOSIS — R300 Dysuria: Secondary | ICD-10-CM | POA: Insufficient documentation

## 2017-11-16 DIAGNOSIS — I10 Essential (primary) hypertension: Secondary | ICD-10-CM | POA: Insufficient documentation

## 2017-11-16 DIAGNOSIS — Z7984 Long term (current) use of oral hypoglycemic drugs: Secondary | ICD-10-CM | POA: Insufficient documentation

## 2017-11-16 DIAGNOSIS — N451 Epididymitis: Secondary | ICD-10-CM | POA: Insufficient documentation

## 2017-11-16 DIAGNOSIS — R361 Hematospermia: Secondary | ICD-10-CM | POA: Insufficient documentation

## 2017-11-16 DIAGNOSIS — R8290 Unspecified abnormal findings in urine: Secondary | ICD-10-CM | POA: Insufficient documentation

## 2017-11-16 DIAGNOSIS — E119 Type 2 diabetes mellitus without complications: Secondary | ICD-10-CM | POA: Insufficient documentation

## 2017-11-16 DIAGNOSIS — N50811 Right testicular pain: Secondary | ICD-10-CM

## 2017-11-16 LAB — HIV ANTIBODY (ROUTINE TESTING W REFLEX): HIV Screen 4th Generation wRfx: NONREACTIVE

## 2017-11-16 NOTE — ED Triage Notes (Signed)
Pt reports having right testicle pain that radiates into right groin. [facility] Now has fatigue and reports swollen left side lymph nodes. Reports dark colored semen.  Airway intact.

## 2017-11-16 NOTE — ED Notes (Addendum)
Called for vitals check with no response. 

## 2017-11-17 ENCOUNTER — Emergency Department (HOSPITAL_COMMUNITY): Payer: Self-pay

## 2017-11-17 LAB — URINALYSIS, ROUTINE W REFLEX MICROSCOPIC
Bacteria, UA: NONE SEEN
Bilirubin Urine: NEGATIVE
HGB URINE DIPSTICK: NEGATIVE
Ketones, ur: 5 mg/dL — AB
Leukocytes, UA: NEGATIVE
NITRITE: NEGATIVE
PH: 5 (ref 5.0–8.0)
Protein, ur: NEGATIVE mg/dL
SPECIFIC GRAVITY, URINE: 1.029 (ref 1.005–1.030)
Squamous Epithelial / LPF: NONE SEEN

## 2017-11-17 MED ORDER — HYDROCODONE-ACETAMINOPHEN 5-325 MG PO TABS: 1.0000 | ORAL_TABLET | ORAL | 0 refills | Status: DC | PRN

## 2017-11-17 MED ORDER — LEVOFLOXACIN 500 MG PO TABS: 500.0000 mg | ORAL_TABLET | Freq: Every day | ORAL | 0 refills | Status: DC

## 2017-11-17 NOTE — ED Provider Notes (Signed)
[facility] EMERGENCY DEPARTMENT Provider Note   CSN: [id number] Arrival date & time: 11/16/17  1727     History   Chief Complaint Chief Complaint  Patient presents with  . Groin Pain    HPI Aleck Locklin is a 42 y.o. male.  The history is provided by the patient and medical records.  Groin Pain     42 year old male with history of diabetes and hypertension, presenting to the ED for right-sided testicle pain.  Patient states this is been ongoing for about 2 days now.  States right testicle has become swollen and painful.  He does report some dysuria that developed earlier today as well as some reported blood in his semen.  He denies any penile discharge.  No new sexual partner, reports he always uses protection.  He reports some intermittent issues with his testicles over the years, states he had a "fluid problem" as well as a "blood flow problem" in his testicles in the past, treated with oral medications.  Has not had any prior surgeries.  He has not had any nausea, vomiting, or diarrhea.  No abdominal pain.  He was seen at urgent care on 11/14/17-- had STD testing done which was all negative.  He also had PSA sent that was normal.  Past Medical History:  Diagnosis Date  . Diabetes mellitus without complication (HCC)   . Hypertension     There are no active problems to display for this patient.   History reviewed. No pertinent surgical history.     Home Medications    Prior to Admission medications   Medication Sig Start Date End Date Taking? Authorizing Provider  albuterol (PROVENTIL HFA;VENTOLIN HFA) 108 (90 Base) MCG/ACT inhaler Inhale 2 puffs into the lungs every 4 (four) hours as needed for wheezing or shortness of breath (cough, shortness of breath or wheezing.). 09/21/17   Elvina Sidle, MD  amLODipine (NORVASC) 10 MG tablet Take 1 tablet (10 mg total) by mouth daily. 11/14/17   Ofilia Neas, PA-C  gabapentin (NEURONTIN) 100 MG capsule Take  1 capsule (100 mg total) by mouth 3 (three) times daily. 09/13/17   Elvina Sidle, MD  glipiZIDE (GLUCOTROL) 5 MG tablet Take 1 tablet (5 mg total) by mouth daily before breakfast. 09/13/17   Elvina Sidle, MD  metFORMIN (GLUCOPHAGE) 500 MG tablet Take 1 tablet (500 mg total) by mouth 2 (two) times daily with a meal. 09/13/17   Elvina Sidle, MD    Family History Family History  Problem Relation Age of Onset  . Diabetes Mother   . Hypertension Mother     Social History Social History   Tobacco Use  . Smoking status: Never Smoker  . Smokeless tobacco: Never Used  Substance Use Topics  . Alcohol use: No  . Drug use: No     Allergies   Iodine   Review of Systems Review of Systems  Genitourinary: Positive for dysuria, scrotal swelling and testicular pain.  All other systems reviewed and are negative.    Physical Exam Updated Vital Signs BP (!) 149/105   Pulse (!) 105   Temp 99.1 F (37.3 C) (Oral)   Resp 14   SpO2 100%   Physical Exam  Constitutional: He is oriented to person, place, and time. He appears well-developed and well-nourished.  HENT:  Head: Normocephalic and atraumatic.  Mouth/Throat: Oropharynx is clear and moist.  Eyes: Conjunctivae and EOM are normal. Pupils are equal, round, and reactive to light.  Neck: Normal range  of motion.  Cardiovascular: Normal rate, regular rhythm and normal heart sounds.  Pulmonary/Chest: Effort normal and breath sounds normal. No stridor.  Abdominal: Soft. Bowel sounds are normal. There is no tenderness. There is no rebound.  Genitourinary: Penis normal. Right testis shows swelling and tenderness. Circumcised.  Genitourinary Comments: Exam chaperoned by NT Right testicle is swollen compared with left, no appreciable/discrete mass; locally tender, more so on lateral edge; normal lie; no penile discharge noted  Musculoskeletal: Normal range of motion.  Neurological: He is alert and oriented to person, place, and  time.  Skin: Skin is warm and dry.  Psychiatric: He has a normal mood and affect.  Nursing note and vitals reviewed.    ED Treatments / Results  Labs (all labs ordered are listed, but only abnormal results are displayed) Labs Reviewed  URINALYSIS, ROUTINE W REFLEX MICROSCOPIC - Abnormal; Notable for the following components:      Result Value   Glucose, UA >=500 (*)    Ketones, ur 5 (*)    All other components within normal limits    EKG  EKG Interpretation None       Radiology [place] Scrotum  Result Date: 11/17/2017 CLINICAL DATA:  Initial evaluation for acute right testicular pain and swelling for 2 days. EXAM: SCROTAL ULTRASOUND DOPPLER ULTRASOUND OF THE TESTICLES TECHNIQUE: Complete ultrasound examination of the testicles, epididymis, and other scrotal structures was performed. Color and spectral Doppler ultrasound were also utilized to evaluate blood flow to the testicles. COMPARISON:  None. FINDINGS: Right testicle Measurements: 4.7 x 3.0 x 3.5 cm. No mass or microlithiasis visualized. Left testicle Measurements: 4.6 x 2.7 x 2.6 cm. No mass or microlithiasis visualized. Right epididymis: Right epididymis relatively normal in size but demonstrates increased vascularity, most notable at the epididymal tail. Left epididymis: Left epididymis is somewhat enlarged with diffusely increased vascularity. Hydrocele:  None visualized. Varicocele:  None visualized. Pulsed Doppler interrogation of both testes demonstrates normal low resistance arterial and venous waveforms bilaterally. Mild scrotal wall thickening/edema noted, right slightly worse than left. IMPRESSION: 1. Increased vascularity within the epididymi bilaterally, suggestive of acute epididymitis. 2. Mild overlying scrotal wall thickening/edema. 3. No other acute abnormality identified.  No evidence for torsion. Electronically Signed   By: Rise MuBenjamin  McClintock M.D.   On: 11/17/2017 02:28   [place] Pelvic Doppler (torsion R/o Or Mass  Arterial Flow)  Result Date: 11/17/2017 CLINICAL DATA:  Initial evaluation for acute right testicular pain and swelling for 2 days. EXAM: SCROTAL ULTRASOUND DOPPLER ULTRASOUND OF THE TESTICLES TECHNIQUE: Complete ultrasound examination of the testicles, epididymis, and other scrotal structures was performed. Color and spectral Doppler ultrasound were also utilized to evaluate blood flow to the testicles. COMPARISON:  None. FINDINGS: Right testicle Measurements: 4.7 x 3.0 x 3.5 cm. No mass or microlithiasis visualized. Left testicle Measurements: 4.6 x 2.7 x 2.6 cm. No mass or microlithiasis visualized. Right epididymis: Right epididymis relatively normal in size but demonstrates increased vascularity, most notable at the epididymal tail. Left epididymis: Left epididymis is somewhat enlarged with diffusely increased vascularity. Hydrocele:  None visualized. Varicocele:  None visualized. Pulsed Doppler interrogation of both testes demonstrates normal low resistance arterial and venous waveforms bilaterally. Mild scrotal wall thickening/edema noted, right slightly worse than left. IMPRESSION: 1. Increased vascularity within the epididymi bilaterally, suggestive of acute epididymitis. 2. Mild overlying scrotal wall thickening/edema. 3. No other acute abnormality identified.  No evidence for torsion. Electronically Signed   By: Rise MuBenjamin  McClintock M.D.   On: 11/17/2017 02:28  Procedures Procedures (including critical care time)  Medications Ordered in ED Medications - No data to display   Initial Impression / Assessment and Plan / ED Course  I have reviewed the triage vital signs and the nursing notes.  Pertinent labs & imaging results that were available during my care of the patient were reviewed by me and considered in my medical decision making (see chart for details).  42 year old male presenting to the ED with right testicle pain and swelling.  Was seen a few days ago at urgent care and had STD  testing that was normal.  Reports ongoing swelling and pain in the right testicle.  Also reports some dark colored urine and blood in his semen.  Does report remote history of "testicle issues".  On exam he does have some swelling of the right testicle compared with left but there is no discrete mass.  No penile discharge.  Normal lie.  UA here without signs of infection.  Scrotal ultrasound was obtained revealing findings of acute epididymitis.  This is likely the source of his pain.  In light of his negative STD testing, will start on Levaquin and have him follow-up closely with urology.  He understands to return here for any new/worsening symptoms.  Final Clinical Impressions(s) / ED Diagnoses   Final diagnoses:  Pain in right testicle    ED Discharge Orders    None       Garlon Hatchet, PA-C 11/17/17 4098    Shon Baton, MD 11/17/17 [phone]

## 2017-11-17 NOTE — ED Notes (Signed)
ED Provider at bedside. 

## 2017-11-17 NOTE — Discharge Instructions (Signed)
Take the prescribed medication as directed. Follow-up with urology if symptoms not improving or any new/acute changes over the next few days. Return to the ED for new concerns.

## 2017-11-17 NOTE — ED Notes (Signed)
Patient transported to Ultrasound 

## 2018-05-01 IMAGING — CT CT CERVICAL SPINE W/O CM
3 of 4 series · 12 of 33 positions shown, 14 images · non-contrast
Comparison: None.

CLINICAL DATA: 41 y/o M; neck pain radiating to the left shoulder
and upper extremity. Initial exam.

EXAM:
CT CERVICAL SPINE WITHOUT CONTRAST
TECHNIQUE: Multidetector CT imaging of the cervical spine was performed without
intravenous contrast. Multiplanar CT image reconstructions were also
generated.

[Series 4: c_spine 2.0 st · axial · 0.33mm/px · z∈[-205,-55]mm · 4 of 107 slices shown, 5 images]
[im 16/107  soft-tissue]
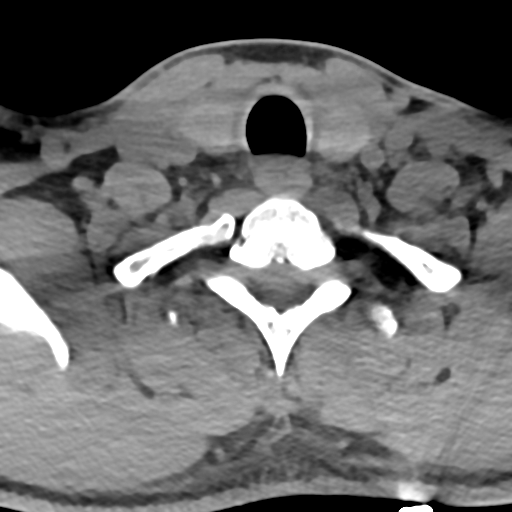
[im 16/107  bone]
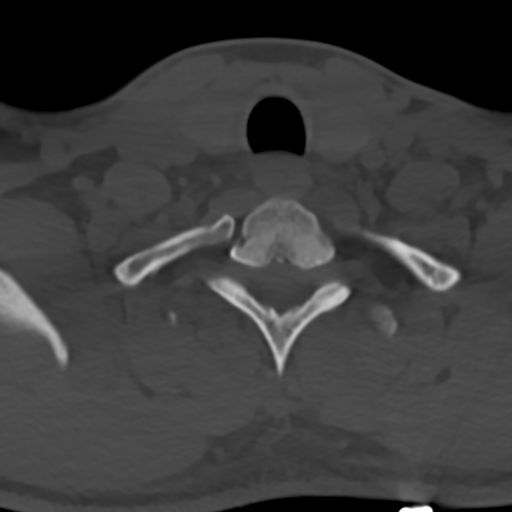
[im 46/107  bone]
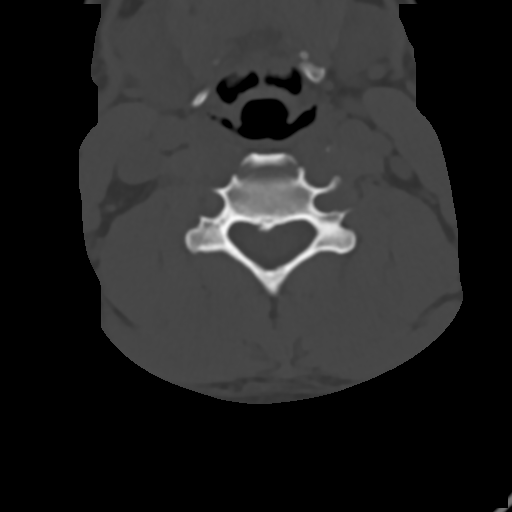
[im 61/107  bone]
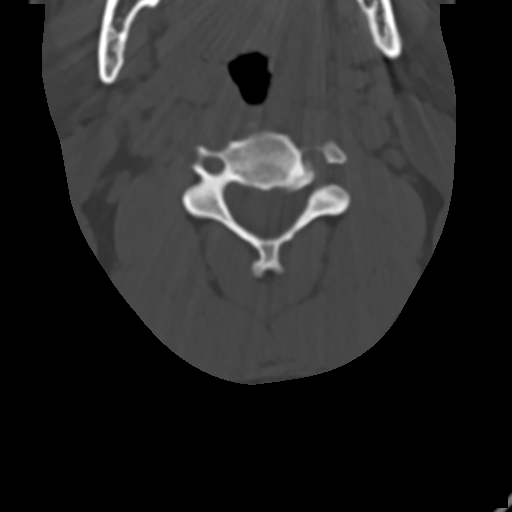
[im 91/107  bone]
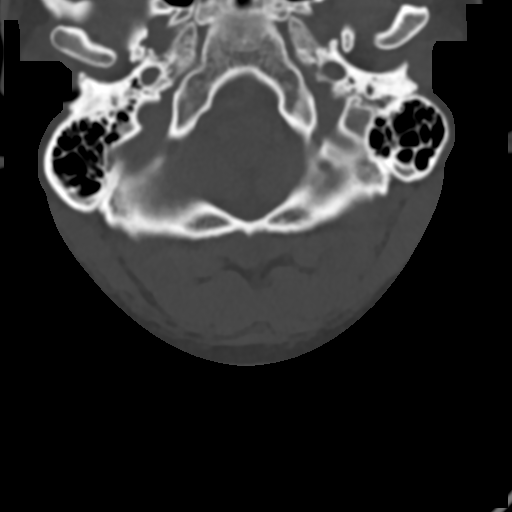

[Series 6: c_spine 2.0 sag bone · sagittal · 0.27mm/px · 5 of 61 slices shown, 6 images]
[im 21/61  bone]
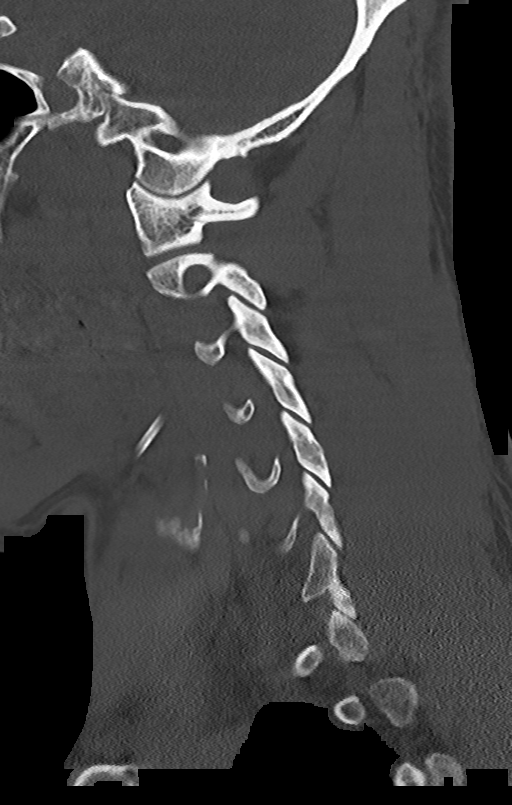
[im 26/61  bone]
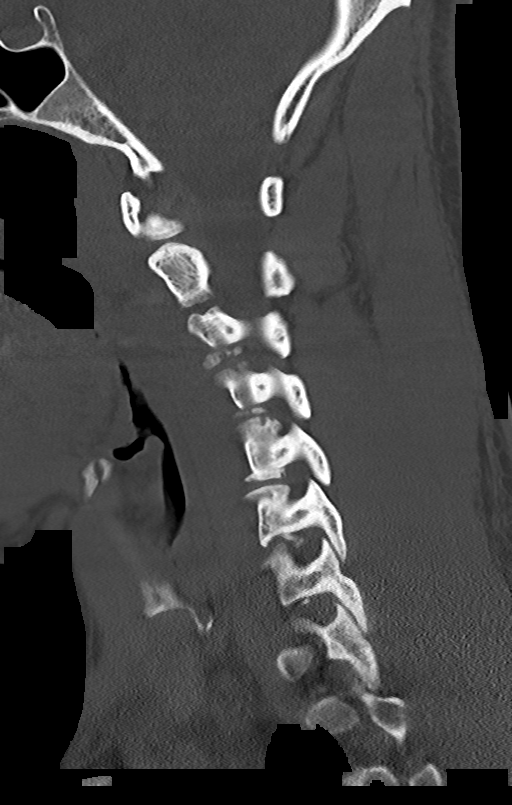
[im 31/61  soft-tissue]
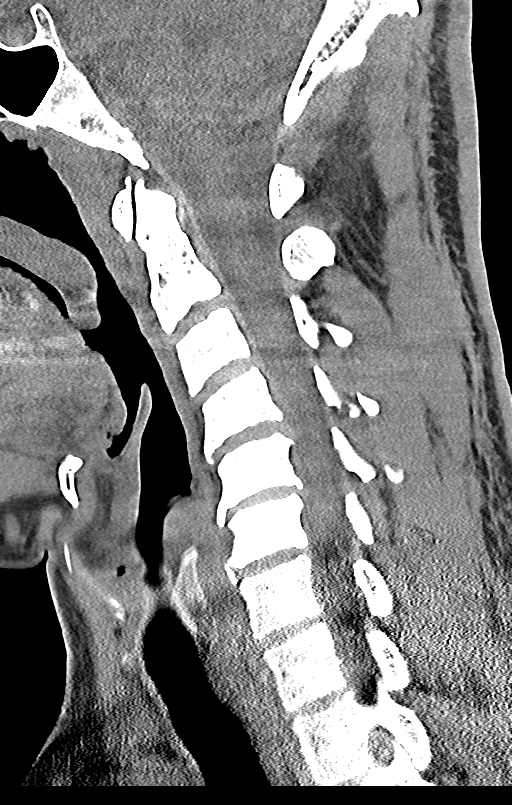
[im 31/61  bone]
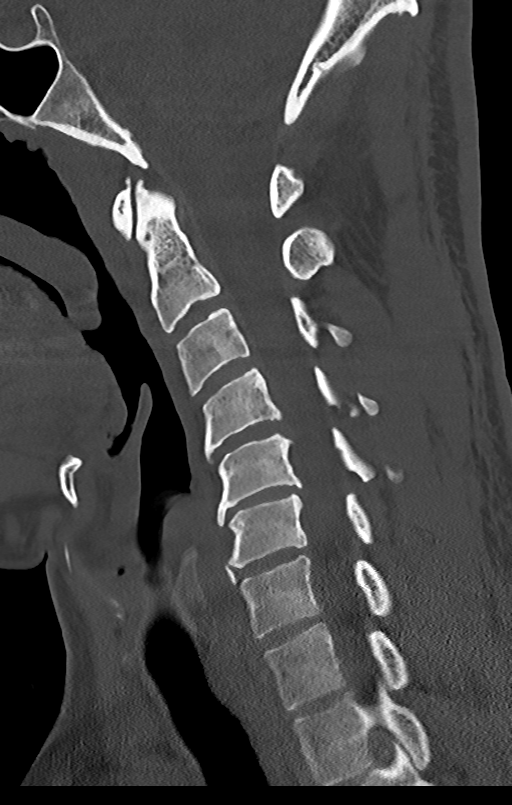
[im 36/61  bone]
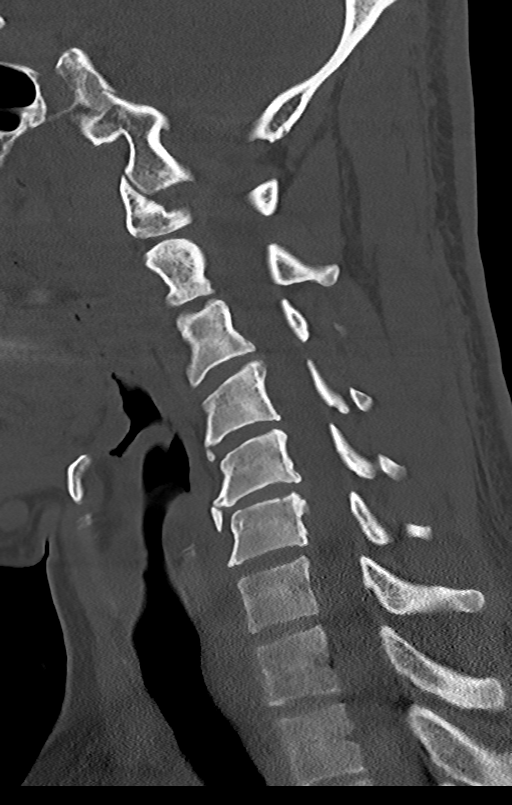
[im 41/61  bone]
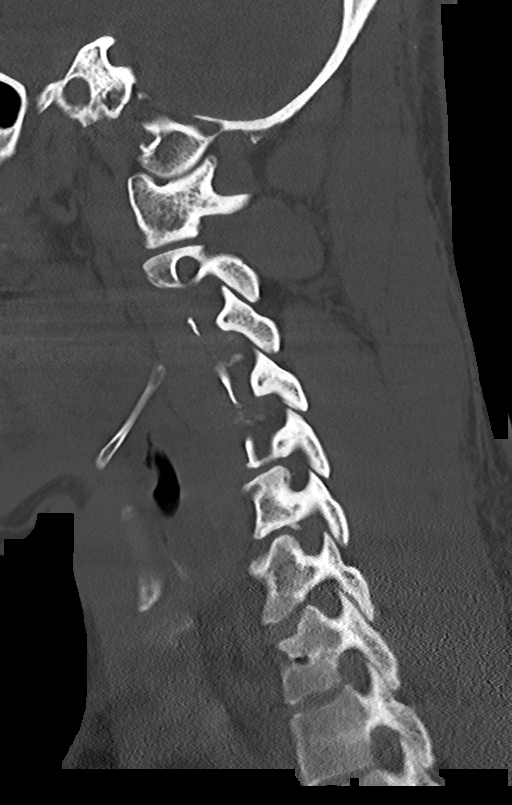

[Series 7: c_spine 2.0 cor bone · coronal · 0.31mm/px · 3 of 61 slices shown]
[im 13/61  bone]
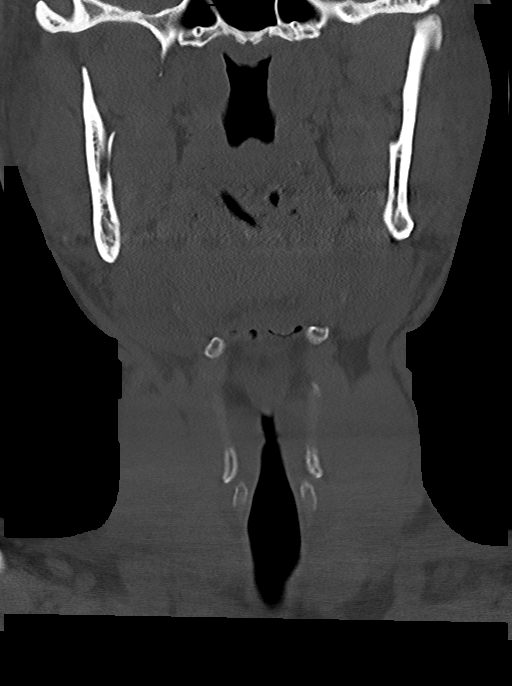
[im 25/61  bone]
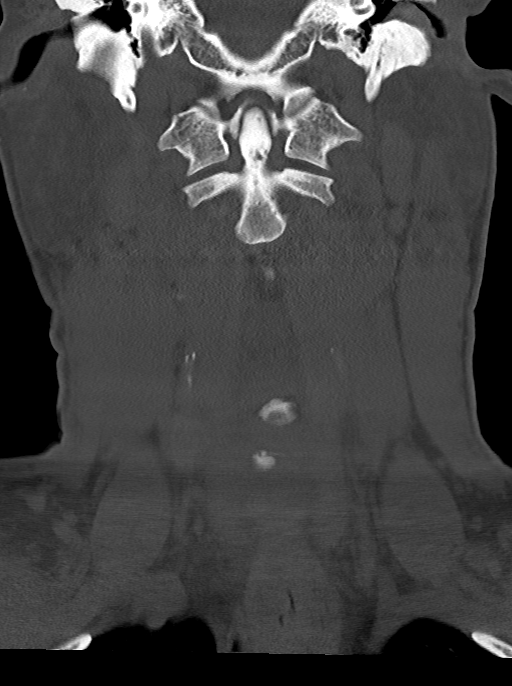
[im 37/61  bone]
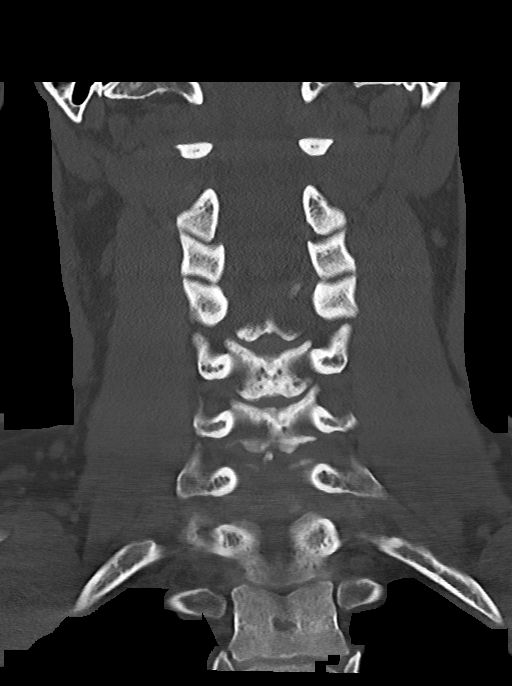

[12 of 33 positions shown; findings below may reference images not displayed]

FINDINGS: Alignment: Mild reversal of cervical curvature with apex at C5. No
listhesis.

Skull base and vertebrae: No acute fracture. No primary bone lesion
or focal pathologic process.

Soft tissues and spinal canal: No prevertebral fluid or swelling. No
visible canal hematoma.

Disc levels: Mild cervical spondylosis with multilevel discogenic
degenerative changes with mild loss of disc space height at the C5-6
and C6-7 levels. Small anterior marginal osteophytes are present at
C3-C6. Uncovertebral hypertrophy encroaches on the neural foramen
bilaterally from C3-4 through C6-7 in greatest at C6-7. C4-5 small
right central disc protrusion and C5-6 left central disc protrusions
efface the ventral thecal sac and may contact the anterior cord.
Osteophytosis of the anterior C1-2 articulation.

Upper chest: Negative.

Other: None.
IMPRESSION: 1. Mild reversal of cervical curvature. No listhesis. No acute
fracture.
2. Mild cervical spondylosis with predominantly discogenic
degenerative changes.
3. Uncovertebral hypertrophy encroaches on the neural foramen
bilaterally at C3-4 through C6-7 greatest at the C6-7 level.
4. Small central disc protrusions at C4-5 and C5-6 efface the
ventral thecal sac and may contact the anterior cord.

By: Ihong Waledloi M.D.

## 2024-02-18 ENCOUNTER — Inpatient Hospital Stay (HOSPITAL_COMMUNITY)
Admission: EM | Admit: 2024-02-18 | Discharge: 2024-02-22 | DRG: 871 | Disposition: A | Discharge status: Home / Self Care | Attending: Family Medicine | Admitting: Family Medicine

## 2024-02-18 ENCOUNTER — Other Ambulatory Visit: Payer: Self-pay

## 2024-02-18 ENCOUNTER — Encounter (HOSPITAL_COMMUNITY): Payer: Self-pay

## 2024-02-18 ENCOUNTER — Emergency Department (HOSPITAL_COMMUNITY): Payer: Self-pay

## 2024-02-18 DIAGNOSIS — E871 Hypo-osmolality and hyponatremia: Secondary | ICD-10-CM | POA: Diagnosis present

## 2024-02-18 DIAGNOSIS — R6520 Severe sepsis without septic shock: Secondary | ICD-10-CM | POA: Diagnosis present

## 2024-02-18 DIAGNOSIS — Z888 Allergy status to other drugs, medicaments and biological substances status: Secondary | ICD-10-CM | POA: Diagnosis not present

## 2024-02-18 DIAGNOSIS — Z87891 Personal history of nicotine dependence: Secondary | ICD-10-CM | POA: Diagnosis not present

## 2024-02-18 DIAGNOSIS — A419 Sepsis, unspecified organism: Secondary | ICD-10-CM | POA: Diagnosis present

## 2024-02-18 DIAGNOSIS — Z8249 Family history of ischemic heart disease and other diseases of the circulatory system: Secondary | ICD-10-CM | POA: Diagnosis not present

## 2024-02-18 DIAGNOSIS — R042 Hemoptysis: Secondary | ICD-10-CM | POA: Diagnosis present

## 2024-02-18 DIAGNOSIS — Z833 Family history of diabetes mellitus: Secondary | ICD-10-CM

## 2024-02-18 DIAGNOSIS — J189 Pneumonia, unspecified organism: Principal | ICD-10-CM | POA: Diagnosis present

## 2024-02-18 DIAGNOSIS — Z1152 Encounter for screening for COVID-19: Secondary | ICD-10-CM

## 2024-02-18 DIAGNOSIS — R079 Chest pain, unspecified: Secondary | ICD-10-CM | POA: Diagnosis present

## 2024-02-18 DIAGNOSIS — Z808 Family history of malignant neoplasm of other organs or systems: Secondary | ICD-10-CM

## 2024-02-18 DIAGNOSIS — E1159 Type 2 diabetes mellitus with other circulatory complications: Secondary | ICD-10-CM | POA: Diagnosis present

## 2024-02-18 DIAGNOSIS — I152 Hypertension secondary to endocrine disorders: Secondary | ICD-10-CM | POA: Diagnosis present

## 2024-02-18 DIAGNOSIS — E119 Type 2 diabetes mellitus without complications: Secondary | ICD-10-CM

## 2024-02-18 DIAGNOSIS — E1165 Type 2 diabetes mellitus with hyperglycemia: Secondary | ICD-10-CM | POA: Diagnosis present

## 2024-02-18 DIAGNOSIS — N179 Acute kidney failure, unspecified: Secondary | ICD-10-CM | POA: Diagnosis present

## 2024-02-18 DIAGNOSIS — Z79899 Other long term (current) drug therapy: Secondary | ICD-10-CM

## 2024-02-18 DIAGNOSIS — Z7984 Long term (current) use of oral hypoglycemic drugs: Secondary | ICD-10-CM

## 2024-02-18 LAB — BASIC METABOLIC PANEL WITH GFR
Anion gap: 12 (ref 5–15)
BUN: 26 mg/dL — ABNORMAL HIGH (ref 6–20)
CO2: 20 mmol/L — ABNORMAL LOW (ref 22–32)
Calcium: 8.5 mg/dL — ABNORMAL LOW (ref 8.9–10.3)
Chloride: 99 mmol/L (ref 98–111)
Creatinine, Ser: 1.42 mg/dL — ABNORMAL HIGH (ref 0.61–1.24)
GFR, Estimated: >60 mL/min (ref >60)
Glucose, Bld: 244 mg/dL — ABNORMAL HIGH (ref 70–99)
Potassium: 3.9 mmol/L (ref 3.5–5.1)
Sodium: 131 mmol/L — ABNORMAL LOW (ref 135–145)

## 2024-02-18 LAB — CBC
HCT: 47.3 % (ref 39.0–52.0)
Hemoglobin: 15.6 g/dL (ref 13.0–17.0)
MCH: 28.8 pg (ref 26.0–34.0)
MCHC: 33 g/dL (ref 30.0–36.0)
MCV: 87.4 fL (ref 80.0–100.0)
Platelets: 226 10*3/uL (ref 150–400)
RBC: 5.41 MIL/uL (ref 4.22–5.81)
RDW: 13.3 % (ref 11.5–15.5)
WBC: 29.3 10*3/uL — ABNORMAL HIGH (ref 4.0–10.5)
nRBC: 0 % (ref 0.0–0.2)

## 2024-02-18 LAB — CBG MONITORING, ED: Glucose-Capillary: 265 mg/dL — ABNORMAL HIGH (ref 70–99)

## 2024-02-18 LAB — TROPONIN I (HIGH SENSITIVITY): Troponin I (High Sensitivity): 11 ng/L (ref <18)

## 2024-02-18 MED ORDER — HEPARIN SODIUM (PORCINE) 5000 UNIT/ML IJ SOLN
5000.0000 [IU] | Freq: Three times a day (TID) | INTRAMUSCULAR | Status: DC
  Administered 2024-02-19 – 2024-02-22 (×10): 5000 [IU] via SUBCUTANEOUS
  Filled 2024-02-18 (×10): qty 1

## 2024-02-18 MED ORDER — INSULIN ASPART 100 UNIT/ML IJ SOLN
0.0000 [IU] | Freq: Three times a day (TID) | INTRAMUSCULAR | Status: DC
  Administered 2024-02-19 (×3): 2 [IU] via SUBCUTANEOUS
  Administered 2024-02-20: 5 [IU] via SUBCUTANEOUS
  Administered 2024-02-20: 3 [IU] via SUBCUTANEOUS
  Administered 2024-02-20: 2 [IU] via SUBCUTANEOUS
  Administered 2024-02-21 (×2): 3 [IU] via SUBCUTANEOUS
  Administered 2024-02-21 – 2024-02-22 (×2): 2 [IU] via SUBCUTANEOUS
  Filled 2024-02-18: qty 0.09

## 2024-02-18 MED ORDER — ONDANSETRON HCL 4 MG/2ML IJ SOLN
4.0000 mg | Freq: Once | INTRAMUSCULAR | Status: AC
  Administered 2024-02-18: 4 mg via INTRAVENOUS
  Filled 2024-02-18: qty 2

## 2024-02-18 MED ORDER — SODIUM CHLORIDE 0.9% FLUSH
3.0000 mL | Freq: Two times a day (BID) | INTRAVENOUS | Status: DC
  Administered 2024-02-19 – 2024-02-22 (×7): 3 mL via INTRAVENOUS

## 2024-02-18 MED ORDER — ONDANSETRON HCL 4 MG/2ML IJ SOLN: 4.0000 mg | Freq: Four times a day (QID) | INTRAMUSCULAR | Status: DC | PRN

## 2024-02-18 MED ORDER — INSULIN ASPART 100 UNIT/ML IJ SOLN
0.0000 [IU] | Freq: Every day | INTRAMUSCULAR | Status: DC
  Administered 2024-02-19: 3 [IU] via SUBCUTANEOUS
  Administered 2024-02-19: 2 [IU] via SUBCUTANEOUS
  Administered 2024-02-20: 3 [IU] via SUBCUTANEOUS
  Filled 2024-02-18: qty 0.05

## 2024-02-18 MED ORDER — HYDROMORPHONE HCL 1 MG/ML IJ SOLN
0.5000 mg | INTRAMUSCULAR | Status: AC | PRN
  Administered 2024-02-19 (×2): 0.5 mg via INTRAVENOUS
  Filled 2024-02-18: qty 0.5
  Filled 2024-02-18: qty 1

## 2024-02-18 MED ORDER — HYDROCODONE-ACETAMINOPHEN 5-325 MG PO TABS
1.0000 | ORAL_TABLET | ORAL | Status: DC | PRN
  Administered 2024-02-18 – 2024-02-19 (×2): 2 via ORAL
  Administered 2024-02-19: 1 via ORAL
  Administered 2024-02-19 – 2024-02-22 (×12): 2 via ORAL
  Filled 2024-02-18 (×3): qty 2
  Filled 2024-02-18: qty 1
  Filled 2024-02-18 (×12): qty 2

## 2024-02-18 MED ORDER — SODIUM CHLORIDE 0.9 % IV SOLN
500.0000 mg | INTRAVENOUS | Status: DC
  Administered 2024-02-19 – 2024-02-20 (×2): 500 mg via INTRAVENOUS
  Filled 2024-02-18 (×2): qty 5

## 2024-02-18 MED ORDER — ACETAMINOPHEN 325 MG PO TABS: 650.0000 mg | ORAL_TABLET | Freq: Four times a day (QID) | ORAL | Status: DC | PRN

## 2024-02-18 MED ORDER — SENNOSIDES-DOCUSATE SODIUM 8.6-50 MG PO TABS: 1.0000 | ORAL_TABLET | Freq: Every evening | ORAL | Status: DC | PRN

## 2024-02-18 MED ORDER — SODIUM CHLORIDE 0.9 % IV SOLN
1.0000 g | Freq: Once | INTRAVENOUS | Status: AC
  Administered 2024-02-18: 1 g via INTRAVENOUS
  Filled 2024-02-18: qty 10

## 2024-02-18 MED ORDER — ACETAMINOPHEN 650 MG RE SUPP: 650.0000 mg | Freq: Four times a day (QID) | RECTAL | Status: DC | PRN

## 2024-02-18 MED ORDER — FENTANYL CITRATE PF 50 MCG/ML IJ SOSY
50.0000 ug | PREFILLED_SYRINGE | Freq: Once | INTRAMUSCULAR | Status: AC
  Administered 2024-02-18: 50 ug via INTRAVENOUS
  Filled 2024-02-18: qty 1

## 2024-02-18 MED ORDER — ONDANSETRON HCL 4 MG PO TABS: 4.0000 mg | ORAL_TABLET | Freq: Four times a day (QID) | ORAL | Status: DC | PRN

## 2024-02-18 MED ORDER — GUAIFENESIN ER 600 MG PO TB12
600.0000 mg | ORAL_TABLET | Freq: Two times a day (BID) | ORAL | Status: DC
  Administered 2024-02-18 – 2024-02-22 (×6): 600 mg via ORAL
  Filled 2024-02-18 (×8): qty 1

## 2024-02-18 MED ORDER — SODIUM CHLORIDE 0.9 % IV BOLUS
1000.0000 mL | Freq: Once | INTRAVENOUS | Status: AC
Start: 2024-02-18 — End: 2024-02-19
  Administered 2024-02-18: 1000 mL via INTRAVENOUS

## 2024-02-18 MED ORDER — SODIUM CHLORIDE 0.9 % IV SOLN
2.0000 g | INTRAVENOUS | Status: DC
  Administered 2024-02-19 – 2024-02-21 (×3): 2 g via INTRAVENOUS
  Filled 2024-02-18 (×3): qty 20

## 2024-02-18 MED ORDER — SODIUM CHLORIDE 0.9 % IV BOLUS
1000.0000 mL | Freq: Once | INTRAVENOUS | Status: AC
  Administered 2024-02-18: 1000 mL via INTRAVENOUS

## 2024-02-18 MED ORDER — SODIUM CHLORIDE 0.9 % IV SOLN
500.0000 mg | Freq: Once | INTRAVENOUS | Status: AC
  Administered 2024-02-18: 500 mg via INTRAVENOUS
  Filled 2024-02-18: qty 5

## 2024-02-18 MED ORDER — LACTATED RINGERS IV SOLN
150.0000 mL/h | INTRAVENOUS | Status: AC
  Administered 2024-02-18: 150 mL/h via INTRAVENOUS

## 2024-02-18 MED ORDER — ALBUTEROL SULFATE (2.5 MG/3ML) 0.083% IN NEBU: 2.5000 mg | INHALATION_SOLUTION | Freq: Four times a day (QID) | RESPIRATORY_TRACT | Status: DC | PRN

## 2024-02-18 NOTE — ED Provider Notes (Signed)
 [place] EMERGENCY DEPARTMENT AT [facility] Provider Note   CSN: [id number] Arrival date & time: 02/18/24  1927     Patient presents with: Chest Pain   Gregory Blake is a 48 y.o. male presents emergency department chief complaint of cough and chest pain.  He has a past medical history of diabetes.  Patient reports he has had URI symptoms including painful cough for the past week.  He reports that when he takes a deep breath he has pain on the left side of his chest and when he coughs it also goes into his back.  He has been taking Robitussin without relief.  His significant other states that last night he was burning up to the touch and he reported feeling freezing cold in the bed.  Today he also noticed that when he would cough up green phlegm it would be streaked with blood.  He denies unilateral leg swelling or history of blood clots.    Chest Pain      Prior to Admission medications   Medication Sig Start Date End Date Taking? Authorizing Provider  albuterol  (PROVENTIL  HFA;VENTOLIN  HFA) 108 (90 Base) MCG/ACT inhaler Inhale 2 puffs into the lungs every 4 (four) hours as needed for wheezing or shortness of breath (cough, shortness of breath or wheezing.). 09/21/17   Mario Million, MD  amLODipine  (NORVASC ) 10 MG tablet Take 1 tablet (10 mg total) by mouth daily. 11/14/17   Gretta Ozell CROME, PA-C  gabapentin  (NEURONTIN ) 100 MG capsule Take 1 capsule (100 mg total) by mouth 3 (three) times daily. 09/13/17   Mario Million, MD  glipiZIDE  (GLUCOTROL ) 5 MG tablet Take 1 tablet (5 mg total) by mouth daily before breakfast. 09/13/17   Mario Million, MD  HYDROcodone -acetaminophen  (NORCO/VICODIN) 5-325 MG tablet Take 1 tablet by mouth every 4 (four) hours as needed. 11/17/17   Jarold Olam HERO, PA-C  levofloxacin  (LEVAQUIN ) 500 MG tablet Take 1 tablet (500 mg total) by mouth daily. 11/17/17   Jarold Olam HERO, PA-C  metFORMIN  (GLUCOPHAGE ) 500 MG tablet Take 1 tablet (500 mg total)  by mouth 2 (two) times daily with a meal. 09/13/17   Mario Million, MD    Allergies: Iodine    Review of Systems  Cardiovascular:  Positive for chest pain.    Updated Vital Signs BP 130/82 (BP Location: Right Arm)   Pulse (!) 122   Temp 98.5 F (36.9 C)   Resp (!) 21   SpO2 96%   Physical Exam Vitals and nursing note reviewed.  Constitutional:      General: He is not in acute distress.    Appearance: He is well-developed. He is not diaphoretic.  HENT:     Head: Normocephalic and atraumatic.   Eyes:     General: No scleral icterus.    Conjunctiva/sclera: Conjunctivae normal.    Cardiovascular:     Rate and Rhythm: Normal rate and regular rhythm.     Heart sounds: Normal heart sounds.  Pulmonary:     Effort: Pulmonary effort is normal. Tachypnea present. No respiratory distress.     Breath sounds: Rhonchi and rales present.     Comments: Breathing is rapid and is also guarded. Abdominal:     Palpations: Abdomen is soft.     Tenderness: There is no abdominal tenderness.   Musculoskeletal:     Cervical back: Normal range of motion and neck supple.     Right lower leg: No edema.     Left lower leg: No  edema.   Skin:    General: Skin is warm and dry.   Neurological:     Mental Status: He is alert.   Psychiatric:        Behavior: Behavior normal.     (all labs ordered are listed, but only abnormal results are displayed) Labs Reviewed  BASIC METABOLIC PANEL WITH GFR - Abnormal; Notable for the following components:      Result Value   Sodium 131 (*)    CO2 20 (*)    Glucose, Bld 244 (*)    BUN 26 (*)    Creatinine, Ser 1.42 (*)    Calcium 8.5 (*)    All other components within normal limits  CBC - Abnormal; Notable for the following components:   WBC 29.3 (*)    All other components within normal limits  TROPONIN I (HIGH SENSITIVITY)  TROPONIN I (HIGH SENSITIVITY)    EKG: None  Radiology: DG Chest 2 View Result Date: 02/18/2024 CLINICAL DATA:   Chest pain EXAM: CHEST - 2 VIEW COMPARISON:  05/03/2017 FINDINGS: Consolidation in the left lower lobe. No pleural effusion. Normal cardiac size. No pneumothorax IMPRESSION: Left lower lobe consolidation consistent with pneumonia. Electronically Signed   By: Luke Bun M.D.   On: 02/18/2024 20:25     Procedures   Medications Ordered in the ED  sodium chloride  0.9 % bolus 1,000 mL (has no administration in time range)  cefTRIAXone  (ROCEPHIN ) 1 g in sodium chloride  0.9 % 100 mL IVPB (has no administration in time range)  azithromycin  (ZITHROMAX ) 500 mg in sodium chloride  0.9 % 250 mL IVPB (has no administration in time range)    Clinical Course as of 02/18/24 2142  Fri Feb 18, 2024  2103 WBC(!): 29.3 [AH]  2122 WBC(!): 29.3 [AH]  2126 DG Chest 2 View Chest x-ray appears to show left lower lobe pneumonia [AH]  2127 EKG 12-Lead [AH]  2127 EKG shows sinus tachycardia at a rate of 123 [AH]    Clinical Course User Index [AH] Arloa Chroman, PA-C                                 Medical Decision Making This patient presents to the ED for concern of chest pain and sob, this involves an extensive number of treatment options, and is a complaint that carries with it a high risk of complications and morbidity.  The emergent differential diagnosis for shortness of breath includes, but is not limited to, Pulmonary edema, bronchoconstriction, Pneumonia, Pulmonary embolism, Pneumotherax/ Hemothorax, Dysrythmia, ACS.     Co morbidities:        has a past medical history of Diabetes mellitus without complication (HCC) and Hypertension.   Social Determinants of Health:        SDOH Screenings Tobacco Use: Low Risk  (02/18/2024)   Additional history:  {Additional history obtained from wife at bedside   Lab Tests:  I Ordered, and personally interpreted labs.  The pertinent results include:   Elevated glucose Profound leukocytosis at 29.3 Mild kidney injury/ dehydration ? Vs chronic  Cr1.42 > 1.01 (63yrs ago) BUN 26   Imaging Studies:  I ordered imaging studies including cxr I independently visualized and interpreted imaging which showed LLL consolidation  I agree with the radiologist interpretation  Cardiac Monitoring/ECG:       The patient was maintained on a cardiac monitor.  I personally viewed and interpreted the cardiac monitored which  showed an underlying rhythm of: sinus tachycardia  Medicines ordered and prescription drug management:  I ordered medication including Medications lactated ringers  infusion (150 mL/hr Intravenous New Bag/Given 02/18/24 2349) cefTRIAXone  (ROCEPHIN ) 2 g in sodium chloride  0.9 % 100 mL IVPB (has no administration in time range) azithromycin  (ZITHROMAX ) 500 mg in sodium chloride  0.9 % 250 mL IVPB (has no administration in time range) cefTRIAXone  (ROCEPHIN ) 1 g in sodium chloride  0.9 % 100 mL IVPB (0 g Intravenous Stopped 02/18/24 2351) azithromycin  (ZITHROMAX ) 500 mg in sodium chloride  0.9 % 250 mL IVPB (0 mg Intravenous Stopped 02/18/24 2351) sodium chloride  0.9 % bolus 1,000 mL (0 mLs Intravenous Stopped 02/19/24 0150) fentaNYL  (SUBLIMAZE ) injection 50 mcg (50 mcg Intravenous Given 02/18/24 2223) ondansetron  (ZOFRAN ) injection 4 mg (4 mg Intravenous Given 02/18/24 2223) For chest pain, increased WOB, CAP Reevaluation of the patient after these medicines showed that the patient stayed the same I have reviewed the patients home medicines and have made adjustments as needed  Test Considered:       CTA- for PE r/o however paitent cxr and clinical appearance consistent with CAP  Critical Interventions:       abx, fluids, pain control and respiratory support  Consultations Obtained: Dr. Jorie Blanch for admission  Problem List / ED Course:       (J18.9) Community acquired pneumonia of left lower lobe of lung  (primary encounter diagnosis)   MDM: patient here with CAP- tachypnea and increased WOB   Dispostion:  After  consideration of the diagnostic results and the patients response to treatment, I feel that the patent would benefit from admission.    Amount and/or Complexity of Data Reviewed Labs: ordered. Decision-making details documented in ED Course. Radiology: ordered. Decision-making details documented in ED Course. ECG/medicine tests:  Decision-making details documented in ED Course.  Risk Prescription drug management. Decision regarding hospitalization.        Final diagnoses:  None    ED Discharge Orders     None          Arloa Chroman, PA-C 02/19/24 1228    Mannie Pac T, DO 02/20/24 1635

## 2024-02-18 NOTE — Hospital Course (Signed)
 Gregory Blake is a 48 y.o. male with medical history significant for T2DM, HTN who is admitted with sepsis due to left lower lobe pneumonia.

## 2024-02-18 NOTE — ED Triage Notes (Signed)
 Pt is coming in with shortness of breath that is accompanied by chest pain that has been going on for around 2 days now. He did have a relative die recently from brain cancer so that has been weighing on him a lot and also caused him to smoke a lot of cigarettes. He also mentions having a cough in which he is coughing up some blood but that was after having the cough for around a day or so. He is otherwise stable at this time and cooperative in triage

## 2024-02-18 NOTE — ED Notes (Addendum)
 Pt. Having shortness of breath per family member, RN alerted, pt. put on 2L of oxygen per RN.

## 2024-02-18 NOTE — H&P (Signed)
 History and Physical    Gregory Blake ZOX:[id number] DOB: July 30, 1976 DOA: 02/18/2024  PCP: Patient, No Pcp Per  Patient coming from: Home  I have personally briefly reviewed patient's old medical records in [facility]  Chief Complaint: Shortness of breath, cough  HPI: Gregory Blake is a 48 y.o. male with medical history significant for T2DM, HTN who presented to the ED for evaluation of shortness of breath, cough, left-sided pleuritic chest pain.  Patient states that about 10 days ago he was dealing with an upper respiratory infection.  He says he was isolating at home and was able to manage at home for several days.  Over the last few days he has been having worsening shortness of breath, cough productive of yellow sputum and sometimes blood-tinged sputum, and left lower chest discomfort.  Left lower chest pain is worsened with deep inspiration.  He has been feeling fatigued with bodyaches.  He has been having alternating chills and subjective fevers at home as well as diaphoresis.  He has not had any nausea or vomiting.  Patient states that he has been smoking a lot recently due to stress related to a young family member being on hospice for brain cancer.  He says his current medications include metformin , glimepiride, amlodipine , and HCTZ (has not taken this as he is waiting for a refill).  ED Course  Labs/Imaging on admission: I have personally reviewed following labs and imaging studies.  Initial vitals showed BP 130/82, pulse 123, RR 21, temp 98.5 F, SpO2 96% on room air.  Labs showed WBC 29.3, hemoglobin 15.6, platelets 2026, sodium 131, potassium 3.9, bicarb 20, BUN 26, creatinine 1.42, serum glucose 244, troponin 11.  2 view chest x-ray showed left lower lobe consolidation consistent with pneumonia.  No pneumothorax or pleural effusion.  Patient was given 2 L normal saline, IV ceftriaxone and azithromycin , IV fentanyl 50 mcg.  The hospitalist service was consulted to  admit.  Review of Systems: All systems reviewed and are negative except as documented in history of present illness above.   Past Medical History:  Diagnosis Date   Diabetes mellitus without complication (HCC)    Hypertension     History reviewed. No pertinent surgical history.  Social History: Smoking >1 PPD recently.  Allergies  Allergen Reactions   Iodine Anaphylaxis   Shellfish Allergy Anaphylaxis    Family History  Problem Relation Age of Onset   Diabetes Mother    Hypertension Mother      Prior to Admission medications   Medication Sig Start Date End Date Taking? Authorizing Provider  albuterol  (PROVENTIL  HFA;VENTOLIN  HFA) 108 (90 Base) MCG/ACT inhaler Inhale 2 puffs into the lungs every 4 (four) hours as needed for wheezing or shortness of breath (cough, shortness of breath or wheezing.). 09/21/17   Dain Drown, MD  amLODipine  (NORVASC ) 10 MG tablet Take 1 tablet (10 mg total) by mouth daily. 11/14/17   Bettie Bruce, PA-C  gabapentin  (NEURONTIN ) 100 MG capsule Take 1 capsule (100 mg total) by mouth 3 (three) times daily. 09/13/17   Dain Drown, MD  glipiZIDE  (GLUCOTROL ) 5 MG tablet Take 1 tablet (5 mg total) by mouth daily before breakfast. 09/13/17   Dain Drown, MD  HYDROcodone -acetaminophen  (NORCO/VICODIN) 5-325 MG tablet Take 1 tablet by mouth every 4 (four) hours as needed. 11/17/17   Coretha Dew, PA-C  levofloxacin  (LEVAQUIN ) 500 MG tablet Take 1 tablet (500 mg total) by mouth daily. 11/17/17   Coretha Dew, PA-C  metFORMIN  (GLUCOPHAGE )  500 MG tablet Take 1 tablet (500 mg total) by mouth 2 (two) times daily with a meal. 09/13/17   Dain Drown, MD    Physical Exam: Vitals:   02/18/24 1936 02/18/24 2128  BP: 130/82 119/86  Pulse: (!) 122 (!) 117  Resp: (!) 21 (!) 33  Temp: 98.5 F (36.9 C) 98.9 F (37.2 C)  TempSrc:  Oral  SpO2: 96% 96%   Constitutional: Sitting up in bed, appears fatigued but in NAD, calm, answering questions  appropriately Eyes: EOMI, lids and conjunctivae normal ENMT: Mucous membranes are moist. Posterior pharynx clear of any exudate or lesions.Normal dentition.  Neck: normal, supple, no masses. Respiratory: Diminished breath sounds left lower lung field. Normal respiratory effort while on 2 L O2 via [place]. No accessory muscle use.  Cardiovascular: Tachycardic, no murmurs / rubs / gallops. No extremity edema. 2+ pedal pulses. Abdomen: no tenderness, no masses palpated. Musculoskeletal: no clubbing / cyanosis. No joint deformity upper and lower extremities. Good ROM, no contractures. Normal muscle tone.  Skin: no rashes, lesions, ulcers. No induration Neurologic: Sensation intact. Strength 5/5 in all 4.  Psychiatric: Normal judgment and insight. Alert and oriented x 3. Normal mood.   EKG: Personally reviewed. Sinus tachycardia, rate 123, no acute ischemic changes.  Rate is faster when compared to previous.  Assessment/Plan Principal Problem:   Severe sepsis with acute organ dysfunction [facility]) Active Problems:   Community acquired pneumonia of left lower lobe of lung   AKI (acute kidney injury) (HCC)   Type 2 diabetes mellitus (HCC)   Hypertension associated with diabetes (HCC)   Gregory Blake is a 48 y.o. male with medical history significant for T2DM, HTN who is admitted with sepsis due to left lower lobe pneumonia.  Assessment and Plan: Severe sepsis due to community-acquired pneumonia of left lower lobe: Presenting with leukocytosis, tachycardia, tachypnea, AKI.  CXR consistent with LLL PNA. - Continue IV ceftriaxone and azithromycin  - Continue IV fluid hydration overnight - Obtain blood cultures, strep pneumonia and Legionella urine antigens, check lactic acid - Check COVID/flu/RSV - Flutter valve, Mucinex - Continue supplemental O2 as needed  Acute kidney injury: Creatinine 1.42 in setting of sepsis.  Previous creatinine 1.00 September 2024 based on documentation in Care  Everywhere. - Continue IV fluid hydration overnight - Hold metformin  and HCTZ  Type 2 diabetes: Holding home meds.  Placed on SSI.  Hypertension: He is normotensive, holding antihypertensives for now due to risk for hypotension in setting of severe sepsis.   DVT prophylaxis: heparin injection 5,000 Units Start: 02/19/24 0600 Code Status: Full code Family Communication: Significant other at bedside Disposition Plan: From home and likely discharge to home pending clinical progress Consults called: None Severity of Illness: The appropriate patient status for this patient is INPATIENT. Inpatient status is judged to be reasonable and necessary in order to provide the required intensity of service to ensure the patient's safety. The patient's presenting symptoms, physical exam findings, and initial radiographic and laboratory data in the context of their chronic comorbidities is felt to place them at high risk for further clinical deterioration. Furthermore, it is not anticipated that the patient will be medically stable for discharge from the hospital within 2 midnights of admission.   * I certify that at the point of admission it is my clinical judgment that the patient will require inpatient hospital care spanning beyond 2 midnights from the point of admission due to high intensity of service, high risk for further deterioration and high frequency of  surveillance required.Edith Gores MD [facility]  If 7PM-7AM, please contact night-coverage [email or web address]  02/18/2024, 11:01 PM

## 2024-02-19 DIAGNOSIS — N179 Acute kidney failure, unspecified: Secondary | ICD-10-CM | POA: Diagnosis not present

## 2024-02-19 DIAGNOSIS — J189 Pneumonia, unspecified organism: Secondary | ICD-10-CM | POA: Diagnosis not present

## 2024-02-19 DIAGNOSIS — A419 Sepsis, unspecified organism: Secondary | ICD-10-CM | POA: Diagnosis not present

## 2024-02-19 DIAGNOSIS — E1159 Type 2 diabetes mellitus with other circulatory complications: Secondary | ICD-10-CM | POA: Diagnosis not present

## 2024-02-19 DIAGNOSIS — I152 Hypertension secondary to endocrine disorders: Secondary | ICD-10-CM

## 2024-02-19 LAB — CBC
HCT: 43.8 % (ref 39.0–52.0)
Hemoglobin: 14.5 g/dL (ref 13.0–17.0)
MCH: 29.7 pg (ref 26.0–34.0)
MCHC: 33.1 g/dL (ref 30.0–36.0)
MCV: 89.8 fL (ref 80.0–100.0)
Platelets: 211 10*3/uL (ref 150–400)
RBC: 4.88 MIL/uL (ref 4.22–5.81)
RDW: 13.5 % (ref 11.5–15.5)
WBC: 31.7 10*3/uL — ABNORMAL HIGH (ref 4.0–10.5)
nRBC: 0 % (ref 0.0–0.2)

## 2024-02-19 LAB — GLUCOSE, CAPILLARY
Glucose-Capillary: 175 mg/dL — ABNORMAL HIGH (ref 70–99)
Glucose-Capillary: 175 mg/dL — ABNORMAL HIGH (ref 70–99)
Glucose-Capillary: 213 mg/dL — ABNORMAL HIGH (ref 70–99)

## 2024-02-19 LAB — HEMOGLOBIN A1C
Hgb A1c MFr Bld: 9.6 % — ABNORMAL HIGH (ref 4.8–5.6)
Mean Plasma Glucose: 228.82 mg/dL

## 2024-02-19 LAB — I-STAT CG4 LACTIC ACID, ED
Lactic Acid, Venous: 2.6 mmol/L (ref 0.5–1.9)
Lactic Acid, Venous: 2.3 mmol/L (ref 0.5–1.9)

## 2024-02-19 LAB — COMPREHENSIVE METABOLIC PANEL WITH GFR
ALT: 26 U/L (ref 0–44)
AST: 18 U/L (ref 15–41)
Albumin: 3.2 g/dL — ABNORMAL LOW (ref 3.5–5.0)
Alkaline Phosphatase: 58 U/L (ref 38–126)
Anion gap: 10 (ref 5–15)
BUN: 29 mg/dL — ABNORMAL HIGH (ref 6–20)
CO2: 24 mmol/L (ref 22–32)
Calcium: 8.2 mg/dL — ABNORMAL LOW (ref 8.9–10.3)
Chloride: 96 mmol/L — ABNORMAL LOW (ref 98–111)
Creatinine, Ser: 1.22 mg/dL (ref 0.61–1.24)
GFR, Estimated: >60 mL/min (ref >60)
Glucose, Bld: 170 mg/dL — ABNORMAL HIGH (ref 70–99)
Potassium: 4 mmol/L (ref 3.5–5.1)
Sodium: 130 mmol/L — ABNORMAL LOW (ref 135–145)
Total Bilirubin: 1.4 mg/dL — ABNORMAL HIGH (ref 0.0–1.2)
Total Protein: 6.7 g/dL (ref 6.5–8.1)

## 2024-02-19 LAB — RESP PANEL BY RT-PCR (RSV, FLU A&B, COVID)  RVPGX2
Influenza A by PCR: NEGATIVE
Influenza B by PCR: NEGATIVE
Resp Syncytial Virus by PCR: NEGATIVE
SARS Coronavirus 2 by RT PCR: NEGATIVE

## 2024-02-19 LAB — HIV ANTIBODY (ROUTINE TESTING W REFLEX): HIV Screen 4th Generation wRfx: NONREACTIVE

## 2024-02-19 LAB — PROCALCITONIN: Procalcitonin: 62.9 ng/mL

## 2024-02-19 LAB — TROPONIN I (HIGH SENSITIVITY): Troponin I (High Sensitivity): 13 ng/L (ref <18)

## 2024-02-19 LAB — STREP PNEUMONIAE URINARY ANTIGEN: Strep Pneumo Urinary Antigen: POSITIVE — AB

## 2024-02-19 LAB — CBG MONITORING, ED
Glucose-Capillary: 161 mg/dL — ABNORMAL HIGH (ref 70–99)
Glucose-Capillary: 198 mg/dL — ABNORMAL HIGH (ref 70–99)

## 2024-02-19 MED ORDER — KETOROLAC TROMETHAMINE 30 MG/ML IJ SOLN
30.0000 mg | Freq: Once | INTRAMUSCULAR | Status: AC
  Administered 2024-02-19: 30 mg via INTRAVENOUS
  Filled 2024-02-19: qty 1

## 2024-02-19 NOTE — ED Notes (Signed)
 Breakfast tray was given.

## 2024-02-19 NOTE — ED Notes (Signed)
 Blood cultures ordered after antibiotics administered.

## 2024-02-19 NOTE — Progress Notes (Addendum)
 [facility]  PROGRESS NOTE  Gottlieb Zuercher FMW:[id number] DOB: 12-08-1975 DOA: 02/18/2024 PCP: Patient, No Pcp Per   Brief HPI:   48 y.o. male with medical history significant for T2DM, HTN who presented to the ED for evaluation of shortness of breath, cough, left-sided pleuritic chest pain.    2 view chest x-ray showed left lower lobe consolidation consistent with pneumonia.  No pneumothorax or pleural effusion.   Assessment/Plan:   Severe sepsis due to community-acquired pneumonia of left lower lobe: Presenting with leukocytosis, tachycardia, tachypnea, AKI.  CXR consistent with LLL PNA. - Continue IV ceftriaxone  and azithromycin  - Continue IV fluid hydration overnight -  blood cultures obtained, urinary strep pneumoniae antigen is positive and Legionella urine antigens, obtained -Procalcitonin 62.90 -  COVID/flu/RSV all negative - Flutter valve, Mucinex  - Continue supplemental O2 as needed - Will give 1 dose of Toradol  30 mg IV for left-sided pleuritic pain    Acute kidney injury: Creatinine 1.42 in setting of sepsis.   Previous creatinine 1.00 September 2024 based on documentation in Care Everywhere. -Creatinine has improved to 1.22 - Continue IV fluid hydration  - Hold metformin  and hydrochlorothiazide  Hyponatremia -Sodium is 130 likely in setting of HCTZ use   Type 2 diabetes: Holding home meds.  Placed on SSI. -CBG well-controlled   Hypertension: He is normotensive, holding antihypertensives for now due to risk for hypotension in setting of severe sepsis.    Medications     guaiFENesin   600 mg Oral BID   heparin   5,000 Units Subcutaneous Q8H   insulin  aspart  0-5 Units Subcutaneous QHS   insulin  aspart  0-9 Units Subcutaneous TID WC   sodium chloride  flush  3 mL Intravenous Q12H     Data Reviewed:   CBG:  Recent Labs  Lab 02/18/24 2355 02/19/24 0759  GLUCAP 265* 161*    SpO2: 95 % O2 Flow Rate (L/min): 2 L/min    Vitals:   02/19/24  0645 02/19/24 0700 02/19/24 0800 02/19/24 0849  BP: (!) 125/93 (!) 125/93 115/86   Pulse: 100 (!) 113 (!) 107   Resp: 12 (!) 24 (!) 24   Temp:    98.7 F (37.1 C)  TempSrc:    Oral  SpO2: 97% 96% 95%       Data Reviewed:  Basic Metabolic Panel: Recent Labs  Lab 02/18/24 1952 02/19/24 0442  NA 131* 130*  K 3.9 4.0  CL 99 96*  CO2 20* 24  GLUCOSE 244* 170*  BUN 26* 29*  CREATININE 1.42* 1.22  CALCIUM 8.5* 8.2*    CBC: Recent Labs  Lab 02/18/24 1952 02/19/24 0442  WBC 29.3* 31.7*  HGB 15.6 14.5  HCT 47.3 43.8  MCV 87.4 89.8  PLT 226 211    LFT Recent Labs  Lab 02/19/24 0442  AST 18  ALT 26  ALKPHOS 58  BILITOT 1.4*  PROT 6.7  ALBUMIN 3.2*     Antibiotics: Anti-infectives (From admission, onward)    Start     Dose/Rate Route Frequency Ordered Stop   02/19/24 2100  cefTRIAXone  (ROCEPHIN ) 2 g in sodium chloride  0.9 % 100 mL IVPB        2 g 200 mL/hr over 30 Minutes Intravenous Every 24 hours 02/18/24 2251 02/24/24 2059   02/19/24 2100  azithromycin  (ZITHROMAX ) 500 mg in sodium chloride  0.9 % 250 mL IVPB        500 mg 250 mL/hr over 60 Minutes Intravenous Every 24 hours 02/18/24 2251 02/24/24 2059  02/18/24 2130  cefTRIAXone  (ROCEPHIN ) 1 g in sodium chloride  0.9 % 100 mL IVPB        1 g 200 mL/hr over 30 Minutes Intravenous  Once 02/18/24 2119 02/18/24 2351   02/18/24 2130  azithromycin  (ZITHROMAX ) 500 mg in sodium chloride  0.9 % 250 mL IVPB        500 mg 250 mL/hr over 60 Minutes Intravenous  Once 02/18/24 2119 02/18/24 2351        DVT prophylaxis: Heparin   Code Status: Full code  Family Communication: Discussed with patient's wife at bedside   CONSULTS    Subjective   Still complains of left lower chest pain on deep breathing   Objective    Physical Examination:  General-appears in no acute distress Heart-S1-S2, regular, no murmur auscultated Lungs-clear to auscultation bilaterally, no wheezing or crackles  auscultated Abdomen-soft, nontender, no organomegaly Extremities-no edema in the lower extremities Neuro-alert, oriented x3, no focal deficit noted   Status is: Inpatient:             Sabas GORMAN Brod   [facility] If 7PM-7AM, please contact night-coverage at [email or web address], Office  [phone]   02/19/2024, 8:55 AM  LOS: 1 day

## 2024-02-20 DIAGNOSIS — N179 Acute kidney failure, unspecified: Secondary | ICD-10-CM | POA: Diagnosis not present

## 2024-02-20 DIAGNOSIS — E1159 Type 2 diabetes mellitus with other circulatory complications: Secondary | ICD-10-CM | POA: Diagnosis not present

## 2024-02-20 DIAGNOSIS — J189 Pneumonia, unspecified organism: Secondary | ICD-10-CM | POA: Diagnosis not present

## 2024-02-20 DIAGNOSIS — A419 Sepsis, unspecified organism: Secondary | ICD-10-CM | POA: Diagnosis not present

## 2024-02-20 LAB — GLUCOSE, CAPILLARY
Glucose-Capillary: 145 mg/dL — ABNORMAL HIGH (ref 70–99)
Glucose-Capillary: 182 mg/dL — ABNORMAL HIGH (ref 70–99)
Glucose-Capillary: 219 mg/dL — ABNORMAL HIGH (ref 70–99)
Glucose-Capillary: 286 mg/dL — ABNORMAL HIGH (ref 70–99)
Glucose-Capillary: 269 mg/dL — ABNORMAL HIGH (ref 70–99)

## 2024-02-20 LAB — CBC
HCT: 40.5 % (ref 39.0–52.0)
Hemoglobin: 13.4 g/dL (ref 13.0–17.0)
MCH: 29.1 pg (ref 26.0–34.0)
MCHC: 33.1 g/dL (ref 30.0–36.0)
MCV: 87.9 fL (ref 80.0–100.0)
Platelets: 194 10*3/uL (ref 150–400)
RBC: 4.61 MIL/uL (ref 4.22–5.81)
RDW: 13.5 % (ref 11.5–15.5)
WBC: 20.8 10*3/uL — ABNORMAL HIGH (ref 4.0–10.5)
nRBC: 0 % (ref 0.0–0.2)

## 2024-02-20 LAB — BASIC METABOLIC PANEL WITH GFR
Anion gap: 11 (ref 5–15)
BUN: 32 mg/dL — ABNORMAL HIGH (ref 6–20)
CO2: 23 mmol/L (ref 22–32)
Calcium: 8.2 mg/dL — ABNORMAL LOW (ref 8.9–10.3)
Chloride: 99 mmol/L (ref 98–111)
Creatinine, Ser: 1.07 mg/dL (ref 0.61–1.24)
GFR, Estimated: >60 mL/min (ref >60)
Glucose, Bld: 168 mg/dL — ABNORMAL HIGH (ref 70–99)
Potassium: 3.5 mmol/L (ref 3.5–5.1)
Sodium: 133 mmol/L — ABNORMAL LOW (ref 135–145)

## 2024-02-20 MED ORDER — KETOROLAC TROMETHAMINE 30 MG/ML IJ SOLN
30.0000 mg | Freq: Once | INTRAMUSCULAR | Status: AC
  Administered 2024-02-20: 30 mg via INTRAVENOUS
  Filled 2024-02-20: qty 1

## 2024-02-20 MED ORDER — SENNOSIDES-DOCUSATE SODIUM 8.6-50 MG PO TABS
1.0000 | ORAL_TABLET | Freq: Two times a day (BID) | ORAL | Status: DC
  Administered 2024-02-20 – 2024-02-22 (×4): 1 via ORAL
  Filled 2024-02-20 (×5): qty 1

## 2024-02-20 NOTE — TOC Initial Note (Signed)
 Transition of Care [facility]) - Initial/Assessment Note    Patient Details  Name: Gregory Blake MRN: [id number] Date of Birth: 05-Jun-1976  [facility]) CM/SW Contact:    Sonda Manuella Quill, RN Phone Number: 02/20/2024, 10:12 AM  Clinical Narrative:                 No PCP listed; spoke w/ pt in room; pt says he lives at home; he plans to return at d/c; pt identified POC mother Dell Briner ([phone]); he will arrange transportation; pt verified insurance; his PCP [facility]; he denied SDOH risks; pt says he does not have DME, HH services, or home oxygen; TOC will follow.   Expected Discharge Plan: Home/Self Care Barriers to Discharge: Continued Medical Work up   Patient Goals and CMS Choice Patient states their goals for this hospitalization and ongoing recovery are:: home          Expected Discharge Plan and Services   Discharge Planning Services: CM Consult   Living arrangements for the past 2 months: Single Family Home                                      Prior Living Arrangements/Services Living arrangements for the past 2 months: Single Family Home Lives with:: Parents Patient language and need for interpreter reviewed:: Yes Do you feel safe going back to the place where you live?: Yes      Need for Family Participation in Patient Care: Yes (Comment) Care giver support system in place?: Yes (comment) Current home services:  (n/a) Criminal Activity/Legal Involvement Pertinent to Current Situation/Hospitalization: No - Comment as needed  Activities of Daily Living   ADL Screening (condition at time of admission) Independently performs ADLs?: Yes (appropriate for developmental age) Is the patient deaf or have difficulty hearing?: No Does the patient have difficulty seeing, even when wearing glasses/contacts?: No Does the patient have difficulty concentrating, remembering, or making decisions?: No  Permission  Sought/Granted Permission sought to share information with : Case Manager Permission granted to share information with : Yes, Verbal Permission Granted  Share Information with NAME: Case Manager     Permission granted to share info w Relationship: Hikaru Delorenzo (mother) [phone]     Emotional Assessment Appearance:: Appears stated age Attitude/Demeanor/Rapport: Gracious Affect (typically observed): Accepting Orientation: : Oriented to Self, Oriented to Place, Oriented to  Time, Oriented to Situation   Psych Involvement: No (comment)  Admission diagnosis:  Severe sepsis with acute organ dysfunction (HCC) [A41.9, R65.20] Community acquired pneumonia of left lower lobe of lung [J18.9] Patient Active Problem List   Diagnosis Date Noted   AKI (acute kidney injury) (HCC) 02/18/2024   Type 2 diabetes mellitus (HCC) 02/18/2024   Hypertension associated with diabetes (HCC) 02/18/2024   Severe sepsis with acute organ dysfunction (HCC) 02/18/2024   Community acquired pneumonia of left lower lobe of lung 02/18/2024   PCP:  Patient, No Pcp Per Pharmacy:   [id number] - [place]), [place] - [address] [place]) [place] [address] Phone: [phone] Fax: [phone]     Social Drivers of Health (SDOH) Social History: SDOH Screenings   Food Insecurity: No Food Insecurity (02/20/2024)  Housing: Low Risk  (02/20/2024)  Transportation Needs: No Transportation Needs (02/20/2024)  Utilities: Not At Risk (02/20/2024)  Social Connections: Patient Declined (02/19/2024)  Tobacco Use: Low Risk  (  02/18/2024)   SDOH Interventions: Food Insecurity Interventions: Intervention Not Indicated, Inpatient TOC Housing Interventions: Intervention Not Indicated, Inpatient TOC Transportation Interventions: Intervention Not Indicated, Inpatient TOC Utilities Interventions: Intervention Not Indicated, Inpatient TOC   Readmission Risk Interventions     No  data to display

## 2024-02-20 NOTE — Progress Notes (Signed)
 [facility]  PROGRESS NOTE  Gregory Blake FMW:[id number] DOB: May 11, 1976 DOA: 02/18/2024 PCP: Patient, No Pcp Per   Brief HPI:   48 y.o. male with medical history significant for T2DM, HTN who presented to the ED for evaluation of shortness of breath, cough, left-sided pleuritic chest pain.    2 view chest x-ray showed left lower lobe consolidation consistent with pneumonia.  No pneumothorax or pleural effusion.   Assessment/Plan:   Severe sepsis due to community-acquired pneumonia of left lower lobe: Presenting with leukocytosis, tachycardia, tachypnea, AKI.  CXR consistent with LLL PNA. -Improving, WBC is down to 20,000 - Continue IV ceftriaxone  and azithromycin  - Continue IV fluid hydration overnight -  blood cultures obtained, urinary strep pneumoniae antigen is positive and Legionella urine antigens, obtained -Procalcitonin 62.90 -  COVID/flu/RSV all negative - Flutter valve, Mucinex  - Continue supplemental O2 as needed -Chest wall pain improved with Toradol  yesterday -Will repeat 1 dose of Toradol  30 mg IV for left-sided pleuritic chest pain.     Acute kidney injury: Creatinine 1.42 in setting of sepsis.   Previous creatinine 1.00 September 2024 based on documentation in Care Everywhere. -Creatinine has improved to 1.07 - Continue IV fluid hydration  - Hold metformin  and hydrochlorothiazide  Hyponatremia -Sodium is 130 likely in setting of HCTZ use -Sodium has improved to 133   Type 2 diabetes: Holding home meds.  Placed on SSI. -CBG well-controlled   Hypertension: He is normotensive, holding antihypertensives for now due to risk for hypotension in setting of severe sepsis.    Medications     guaiFENesin   600 mg Oral BID   heparin   5,000 Units Subcutaneous Q8H   insulin  aspart  0-5 Units Subcutaneous QHS   insulin  aspart  0-9 Units Subcutaneous TID WC   sodium chloride  flush  3 mL Intravenous Q12H     Data Reviewed:   CBG:  Recent Labs  Lab  02/19/24 1231 02/19/24 1700 02/19/24 2042 02/20/24 0413 02/20/24 0735  GLUCAP 175* 175* 213* 145* 182*    SpO2: 96 % O2 Flow Rate (L/min): 2 L/min    Vitals:   02/19/24 1200 02/19/24 1731 02/19/24 2045 02/20/24 0415  BP: (!) 115/90  117/89 (!) 130/92  Pulse: 100  (!) 101 97  Resp: 18  20 20   Temp: 97.6 F (36.4 C)  97.6 F (36.4 C) 98.4 F (36.9 C)  TempSrc: Oral  Oral Oral  SpO2: 97%  97% 96%  Weight: 101.1 kg     Height:  6' 1 (1.854 m)        Data Reviewed:  Basic Metabolic Panel: Recent Labs  Lab 02/18/24 1952 02/19/24 0442 02/20/24 0555  NA 131* 130* 133*  K 3.9 4.0 3.5  CL 99 96* 99  CO2 20* 24 23  GLUCOSE 244* 170* 168*  BUN 26* 29* 32*  CREATININE 1.42* 1.22 1.07  CALCIUM 8.5* 8.2* 8.2*    CBC: Recent Labs  Lab 02/18/24 1952 02/19/24 0442 02/20/24 0555  WBC 29.3* 31.7* 20.8*  HGB 15.6 14.5 13.4  HCT 47.3 43.8 40.5  MCV 87.4 89.8 87.9  PLT 226 211 194    LFT Recent Labs  Lab 02/19/24 0442  AST 18  ALT 26  ALKPHOS 58  BILITOT 1.4*  PROT 6.7  ALBUMIN 3.2*     Antibiotics: Anti-infectives (From admission, onward)    Start     Dose/Rate Route Frequency Ordered Stop   02/19/24 2100  cefTRIAXone  (ROCEPHIN ) 2 g in sodium chloride  0.9 % 100  mL IVPB        2 g 200 mL/hr over 30 Minutes Intravenous Every 24 hours 02/18/24 2251 02/24/24 2059   02/19/24 2100  azithromycin  (ZITHROMAX ) 500 mg in sodium chloride  0.9 % 250 mL IVPB        500 mg 250 mL/hr over 60 Minutes Intravenous Every 24 hours 02/18/24 2251 02/24/24 2059   02/18/24 2130  cefTRIAXone  (ROCEPHIN ) 1 g in sodium chloride  0.9 % 100 mL IVPB        1 g 200 mL/hr over 30 Minutes Intravenous  Once 02/18/24 2119 02/18/24 2351   02/18/24 2130  azithromycin  (ZITHROMAX ) 500 mg in sodium chloride  0.9 % 250 mL IVPB        500 mg 250 mL/hr over 60 Minutes Intravenous  Once 02/18/24 2119 02/18/24 2351        DVT prophylaxis: Heparin   Code Status: Full code  Family  Communication: Discussed with patient's wife at bedside   CONSULTS    Subjective   Still complains of left lower chest wall pain.  Coughing up blood in  phlegm.   Objective    Physical Examination:  General-appears in no acute distress Heart-S1-S2, regular, no murmur auscultated Lungs-clear to auscultation bilaterally, no wheezing or crackles auscultated Abdomen-soft, nontender, no organomegaly Extremities-no edema in the lower extremities Neuro-alert, oriented x3, no focal deficit noted   Status is: Inpatient:             Gregory Blake   [facility] If 7PM-7AM, please contact night-coverage at [email or web address], Office  [phone]   02/20/2024, 8:31 AM  LOS: 2 days

## 2024-02-20 NOTE — Plan of Care (Signed)
  Problem: Metabolic: Goal: Ability to maintain appropriate glucose levels will improve Outcome: Progressing   Problem: Respiratory: Goal: Ability to maintain adequate ventilation will improve Outcome: Progressing   Problem: Pain Managment: Goal: General experience of comfort will improve and/or be controlled Outcome: Progressing

## 2024-02-21 LAB — CBC
HCT: 41.8 % (ref 39.0–52.0)
Hemoglobin: 13.7 g/dL (ref 13.0–17.0)
MCH: 28.8 pg (ref 26.0–34.0)
MCHC: 32.8 g/dL (ref 30.0–36.0)
MCV: 87.8 fL (ref 80.0–100.0)
Platelets: 209 10*3/uL (ref 150–400)
RBC: 4.76 MIL/uL (ref 4.22–5.81)
RDW: 13.5 % (ref 11.5–15.5)
WBC: 11.8 10*3/uL — ABNORMAL HIGH (ref 4.0–10.5)
nRBC: 0 % (ref 0.0–0.2)

## 2024-02-21 LAB — LEGIONELLA PNEUMOPHILA SEROGP 1 UR AG: L. pneumophila Serogp 1 Ur Ag: NEGATIVE

## 2024-02-21 LAB — GLUCOSE, CAPILLARY
Glucose-Capillary: 224 mg/dL — ABNORMAL HIGH (ref 70–99)
Glucose-Capillary: 245 mg/dL — ABNORMAL HIGH (ref 70–99)
Glucose-Capillary: 164 mg/dL — ABNORMAL HIGH (ref 70–99)
Glucose-Capillary: 224 mg/dL — ABNORMAL HIGH (ref 70–99)

## 2024-02-21 MED ORDER — AZITHROMYCIN 500 MG PO TABS
500.0000 mg | ORAL_TABLET | Freq: Every day | ORAL | Status: DC
  Administered 2024-02-21: 500 mg via ORAL
  Filled 2024-02-21: qty 1

## 2024-02-21 MED ORDER — LIVING WELL WITH DIABETES BOOK
Freq: Once | Status: DC
  Filled 2024-02-21: qty 1

## 2024-02-21 MED ORDER — SODIUM CHLORIDE 0.9% FLUSH: 10.0000 mL | INTRAVENOUS | Status: DC | PRN

## 2024-02-21 MED ORDER — GUAIFENESIN 100 MG/5ML PO LIQD
5.0000 mL | ORAL | Status: DC | PRN
  Administered 2024-02-21 – 2024-02-22 (×8): 5 mL via ORAL
  Filled 2024-02-21 (×8): qty 10

## 2024-02-21 MED ORDER — SIMETHICONE 80 MG PO CHEW
80.0000 mg | CHEWABLE_TABLET | Freq: Four times a day (QID) | ORAL | Status: DC
  Administered 2024-02-21 – 2024-02-22 (×4): 80 mg via ORAL
  Filled 2024-02-21 (×4): qty 1

## 2024-02-21 NOTE — Progress Notes (Signed)
 [facility]  PROGRESS NOTE  Yordi Krager FMW:[id number] DOB: 14-Aug-1976 DOA: 02/18/2024 PCP: Patient, No Pcp Per   Brief HPI:   48 y.o. male with medical history significant for T2DM, HTN who presented to the ED for evaluation of shortness of breath, cough, left-sided pleuritic chest pain.    2 view chest x-ray showed left lower lobe consolidation consistent with pneumonia.  No pneumothorax or pleural effusion.   Assessment/Plan:   Severe sepsis due to community-acquired pneumonia of left lower lobe: Presenting with leukocytosis, tachycardia, tachypnea, AKI.  CXR consistent with LLL PNA. -Improving, WBC is down to 11,000 - Continue IV ceftriaxone  and azithromycin  - Continue IV fluid hydration overnight -  blood cultures obtained, urinary strep pneumoniae antigen is positive and Legionella urine antigens, obtained -Procalcitonin 62.90 -  COVID/flu/RSV all negative - Flutter valve, Mucinex  - Continue supplemental O2 as needed -Chest wall pain improved with Toradol  yesterday -Will repeat 1 dose of Toradol  30 mg IV for left-sided pleuritic chest pain.     Acute kidney injury: Creatinine 1.42 in setting of sepsis.   Previous creatinine 1.00 September 2024 based on documentation in Care Everywhere. -Creatinine has improved to 1.07 - Continue IV fluid hydration  - Hold metformin  and hydrochlorothiazide  Hyponatremia -Sodium is 130 likely in setting of HCTZ use -Sodium has improved to 133   Type 2 diabetes: Holding home meds.  Placed on SSI. -CBG well-controlled   Hypertension: He is normotensive, holding antihypertensives for now due to risk for hypotension in setting of severe sepsis.    Medications     guaiFENesin   600 mg Oral BID   heparin   5,000 Units Subcutaneous Q8H   insulin  aspart  0-5 Units Subcutaneous QHS   insulin  aspart  0-9 Units Subcutaneous TID WC   senna-docusate  1 tablet Oral BID   sodium chloride  flush  3 mL Intravenous Q12H     Data  Reviewed:   CBG:  Recent Labs  Lab 02/20/24 0413 02/20/24 0735 02/20/24 1117 02/20/24 1650 02/20/24 2101  GLUCAP 145* 182* 219* 286* 269*    SpO2: 98 % O2 Flow Rate (L/min): 2 L/min    Vitals:   02/20/24 0415 02/20/24 1301 02/20/24 2103 02/21/24 0526  BP: (!) 130/92 (!) 123/90 (!) 137/97 132/86  Pulse: 97 89 87 85  Resp: 20 18 18 18   Temp: 98.4 F (36.9 C) 97.8 F (36.6 C) 98 F (36.7 C) 98 F (36.7 C)  TempSrc: Oral Oral Oral Oral  SpO2: 96% 98% 99% 98%  Weight:      Height:          Data Reviewed:  Basic Metabolic Panel: Recent Labs  Lab 02/18/24 1952 02/19/24 0442 02/20/24 0555  NA 131* 130* 133*  K 3.9 4.0 3.5  CL 99 96* 99  CO2 20* 24 23  GLUCOSE 244* 170* 168*  BUN 26* 29* 32*  CREATININE 1.42* 1.22 1.07  CALCIUM 8.5* 8.2* 8.2*    CBC: Recent Labs  Lab 02/18/24 1952 02/19/24 0442 02/20/24 0555 02/21/24 0314  WBC 29.3* 31.7* 20.8* 11.8*  HGB 15.6 14.5 13.4 13.7  HCT 47.3 43.8 40.5 41.8  MCV 87.4 89.8 87.9 87.8  PLT 226 211 194 209    LFT Recent Labs  Lab 02/19/24 0442  AST 18  ALT 26  ALKPHOS 58  BILITOT 1.4*  PROT 6.7  ALBUMIN 3.2*     Antibiotics: Anti-infectives (From admission, onward)    Start     Dose/Rate Route Frequency Ordered Stop  02/19/24 2100  cefTRIAXone  (ROCEPHIN ) 2 g in sodium chloride  0.9 % 100 mL IVPB        2 g 200 mL/hr over 30 Minutes Intravenous Every 24 hours 02/18/24 2251 02/24/24 2059   02/19/24 2100  azithromycin  (ZITHROMAX ) 500 mg in sodium chloride  0.9 % 250 mL IVPB        500 mg 250 mL/hr over 60 Minutes Intravenous Every 24 hours 02/18/24 2251 02/24/24 2059   02/18/24 2130  cefTRIAXone  (ROCEPHIN ) 1 g in sodium chloride  0.9 % 100 mL IVPB        1 g 200 mL/hr over 30 Minutes Intravenous  Once 02/18/24 2119 02/18/24 2351   02/18/24 2130  azithromycin  (ZITHROMAX ) 500 mg in sodium chloride  0.9 % 250 mL IVPB        500 mg 250 mL/hr over 60 Minutes Intravenous  Once 02/18/24 2119 02/18/24 2351         DVT prophylaxis: Heparin   Code Status: Full code  Family Communication: Discussed with patient's wife at bedside   CONSULTS    Subjective   Complains of coughing, hemoptysis has improved.   Objective    Physical Examination:  General-appears in no acute distress Heart-S1-S2, regular, no murmur auscultated Lungs-clear to auscultation bilaterally, no wheezing or crackles auscultated Abdomen-soft, nontender, no organomegaly Extremities-no edema in the lower extremities Neuro-alert, oriented x3, no focal deficit noted   Status is: Inpatient:             Sabas GORMAN Brod   [facility] If 7PM-7AM, please contact night-coverage at [email or web address], Office  [phone]   02/21/2024, 7:33 AM  LOS: 3 days

## 2024-02-21 NOTE — Inpatient Diabetes Management (Signed)
 Inpatient Diabetes Program Recommendations  AACE/ADA: New Consensus Statement on Inpatient Glycemic Control (2015)  Target Ranges:  Prepandial:   less than 140 mg/dL      Peak postprandial:   less than 180 mg/dL (1-2 hours)      Critically ill patients:  140 - 180 mg/dL   Lab Results  Component Value Date   GLUCAP 224 (H) 02/21/2024   HGBA1C 9.6 (H) 02/19/2024    Review of Glycemic Control  Latest Reference Range & Units 02/20/24 07:35 02/20/24 11:17 02/20/24 16:50 02/20/24 21:01 02/21/24 07:36  Glucose-Capillary 70 - 99 mg/dL 817 (H) 780 (H) 713 (H) 269 (H) 224 (H)  (H): Data is abnormally high  Diabetes history: DM2 Outpatient Diabetes medications: Amaryl 2 mg every day, Metformin  1000 mg BID Current orders for Inpatient glycemic control: Novolog  0-9 units TID  Met with patient at bedside.  Reviewed patient's current A1c of 9.6% (average BG of 229 mg.dL). Explained what a A1c is and what it measures. Also reviewed goal A1c with patient, importance of good glucose control @ home, and blood sugar goals.   He has been sick with a cough for the last 2 weeks.  He states his glucose trends are usually closer to 150 mg/dL.  He has not been eating very healthy over the last 2 weeks with his illness.  Explained illness can increased glucose trends.  Educated on The Plate Method, CHO's, portion control, avoiding caloric beverages, CBGs at home fasting and mid afternoon, F/U with PCP every 3 months, bring meter to PCP office, long and short term complications of uncontrolled BG, and importance of exercise.  Ordered the [facility] booklet.  He states he needs a new glucometer.    Discharge Recommendations: Supply/Referral recommendations: Glucometer Test strips Lancet device Lancets   Use Adult Diabetes Insulin  Treatment Post Discharge order set.  Thank you, Wyvonna Pinal, MSN, CDCES Diabetes Coordinator Inpatient Diabetes Program [phone] (team pager from 8a-5p)

## 2024-02-22 ENCOUNTER — Other Ambulatory Visit (HOSPITAL_COMMUNITY): Payer: Self-pay

## 2024-02-22 LAB — GLUCOSE, CAPILLARY: Glucose-Capillary: 186 mg/dL — ABNORMAL HIGH (ref 70–99)

## 2024-02-22 MED ORDER — AZITHROMYCIN 500 MG PO TABS: 500.0000 mg | ORAL_TABLET | Freq: Every day | ORAL | 0 refills | Status: AC

## 2024-02-22 MED ORDER — AMOXICILLIN-POT CLAVULANATE 875-125 MG PO TABS: 1.0000 | ORAL_TABLET | Freq: Two times a day (BID) | ORAL | 0 refills | Status: AC

## 2024-02-22 MED ORDER — AMOXICILLIN-POT CLAVULANATE 875-125 MG PO TABS
1.0000 | ORAL_TABLET | Freq: Two times a day (BID) | ORAL | 0 refills | Status: DC
Start: 2024-02-22 — End: 2024-02-22
  Filled 2024-02-22: qty 6, 3d supply, fill #0

## 2024-02-22 MED ORDER — GUAIFENESIN ER 600 MG PO TB12: 600.0000 mg | ORAL_TABLET | Freq: Two times a day (BID) | ORAL | 0 refills | Status: AC | PRN

## 2024-02-22 MED ORDER — BLOOD GLUCOSE MONITOR SYSTEM W/DEVICE KIT
1.0000 | PACK | Freq: Three times a day (TID) | 0 refills | Status: DC
Start: 2024-02-22 — End: 2024-02-22
  Filled 2024-02-22: qty 1, 30d supply, fill #0

## 2024-02-22 MED ORDER — LANCET DEVICE MISC
1.0000 | Freq: Three times a day (TID) | 0 refills | Status: DC
  Filled 2024-02-22: qty 1, 30d supply, fill #0

## 2024-02-22 MED ORDER — BENZONATATE 100 MG PO CAPS
200.0000 mg | ORAL_CAPSULE | Freq: Three times a day (TID) | ORAL | 0 refills | Status: DC | PRN
  Filled 2024-02-22: qty 20, 4d supply, fill #0

## 2024-02-22 MED ORDER — OXYCODONE HCL 5 MG PO TABS: 5.0000 mg | ORAL_TABLET | Freq: Four times a day (QID) | ORAL | 0 refills | Status: AC | PRN

## 2024-02-22 MED ORDER — ACCU-CHEK SOFTCLIX LANCETS MISC
1.0000 | Freq: Three times a day (TID) | 0 refills | Status: DC
  Filled 2024-02-22: qty 100, 30d supply, fill #0

## 2024-02-22 MED ORDER — GUAIFENESIN ER 600 MG PO TB12
600.0000 mg | ORAL_TABLET | Freq: Two times a day (BID) | ORAL | 0 refills | Status: DC | PRN
  Filled 2024-02-22: qty 20, 10d supply, fill #0

## 2024-02-22 MED ORDER — BLOOD GLUCOSE TEST VI STRP
1.0000 | ORAL_STRIP | Freq: Three times a day (TID) | 0 refills | Status: AC
Start: 2024-02-22 — End: ?

## 2024-02-22 MED ORDER — LANCET DEVICE MISC: 1.0000 | Freq: Three times a day (TID) | 0 refills | Status: AC

## 2024-02-22 MED ORDER — BENZONATATE 100 MG PO CAPS: 200.0000 mg | ORAL_CAPSULE | Freq: Three times a day (TID) | ORAL | 0 refills | Status: AC | PRN

## 2024-02-22 MED ORDER — AZITHROMYCIN 500 MG PO TABS
500.0000 mg | ORAL_TABLET | Freq: Every day | ORAL | 0 refills | Status: DC
  Filled 2024-02-22: qty 1, 1d supply, fill #0

## 2024-02-22 MED ORDER — BLOOD GLUCOSE MONITOR SYSTEM W/DEVICE KIT: 1.0000 | PACK | Freq: Three times a day (TID) | 0 refills | Status: AC

## 2024-02-22 MED ORDER — OXYCODONE HCL 5 MG PO TABS
5.0000 mg | ORAL_TABLET | Freq: Four times a day (QID) | ORAL | 0 refills | Status: DC | PRN
  Filled 2024-02-22: qty 15, 4d supply, fill #0

## 2024-02-22 MED ORDER — ACCU-CHEK SOFTCLIX LANCETS MISC: 1.0000 | Freq: Three times a day (TID) | 0 refills | Status: AC

## 2024-02-22 MED ORDER — BLOOD GLUCOSE TEST VI STRP
1.0000 | ORAL_STRIP | Freq: Three times a day (TID) | 0 refills | Status: DC
  Filled 2024-02-22: qty 50, 17d supply, fill #0

## 2024-02-22 NOTE — Discharge Summary (Addendum)
 Physician Discharge Summary   Patient: Gregory Blake MRN: [id number] DOB: [birth year]  Admit date:     02/18/2024  Discharge date: 02/22/24  Discharge Physician: Sabas GORMAN Brod   PCP: Patient, No Pcp Per   Recommendations at discharge:   Follow-up PCP in 2 weeks  Discharge Diagnoses: Principal Problem:   Severe sepsis with acute organ dysfunction (HCC) Active Problems:   Community acquired pneumonia of left lower lobe of lung   AKI (acute kidney injury) (HCC)   Type 2 diabetes mellitus (HCC)   Hypertension associated with diabetes (HCC)  Resolved Problems:   * No resolved hospital problems. *  Hospital Course: 48 y.o. male with medical history significant for T2DM, HTN who presented to the ED for evaluation of shortness of breath, cough, left-sided pleuritic chest pain.    2 view chest x-ray showed left lower lobe consolidation consistent with pneumonia.  No pneumothorax or pleural effusion.   Assessment and Plan:  Severe sepsis due to community-acquired pneumonia of left lower lobe: - Sepsis physiology has resolved Presenting with leukocytosis, tachycardia, tachypnea, AKI.  CXR consistent with LLL PNA. -Improving, WBC is down to 11,000 -Started on Rocephin  and Zithromax  for 4 days -  blood cultures obtained are negative to date, urinary strep pneumoniae antigen is positive and Legionella urine antigens was negative -Procalcitonin 62.90 -  COVID/flu/RSV all negative -Weaned off oxygen -Discharge on Zithromax  500 mg daily for 1 day only -Augmentin 1 tablet p.o. twice daily for 3 days to complete 7 days of treatment Will give Tessalon Perles, Mucinex , oxycodone  for pain      Acute kidney injury: Creatinine 1.42 in setting of sepsis.   Previous creatinine 1.00 September 2024 based on documentation in Care Everywhere. -Creatinine has improved to 1.07    Hyponatremia -Sodium is 130 likely in setting of HCTZ use -Sodium has improved to 133   Type 2 diabetes: A1c  9.6 - Continue home medications    Hypertension: Continue amlodipine         Consultants:  Procedures performed:   Disposition: Home Diet recommendation:  Discharge Diet Orders (From admission, onward)     Start     Ordered   02/22/24 0000  Diet - low sodium heart healthy        02/22/24 0854           Regular diet DISCHARGE MEDICATION: Allergies as of 02/22/2024       Reactions   Iodine Anaphylaxis   Shellfish Allergy Anaphylaxis        Medication List     STOP taking these medications    NYQUIL PO       TAKE these medications    Accu-Chek Softclix Lancets lancets Use 1 each in the morning, at noon, and at bedtime to check blood sugars.   albuterol  108 (90 Base) MCG/ACT inhaler Commonly known as: VENTOLIN  HFA Inhale 2 puffs into the lungs every 4 (four) hours as needed for wheezing or shortness of breath (cough, shortness of breath or wheezing.).   amLODipine  10 MG tablet Commonly known as: NORVASC  Take 1 tablet (10 mg total) by mouth daily.   amoxicillin-clavulanate 875-125 MG tablet Commonly known as: AUGMENTIN Take 1 tablet by mouth 2 (two) times daily for 3 days.   azithromycin  500 MG tablet Commonly known as: Zithromax  Take 1 tablet (500 mg total) by mouth daily for 1 day.   benzonatate 100 MG capsule Commonly known as: Tessalon Perles Take 2 capsules (200 mg total) by mouth 3 (three) times  daily as needed for cough.   Blood Glucose Monitor System w/Device Kit Use in the morning, at noon, and at bedtime to check blood sugar.   BLOOD GLUCOSE TEST STRIPS Strp Use in the morning, at noon, and at bedtime to check blood sugar.   glimepiride 2 MG tablet Commonly known as: AMARYL Take 2 mg by mouth in the morning and at bedtime.   guaiFENesin  600 MG 12 hr tablet Commonly known as: MUCINEX  Take 1 tablet (600 mg total) by mouth 2 (two) times daily as needed for cough. What changed: Another medication with the same name was removed.  Continue taking this medication, and follow the directions you see here.   Lancet Device Misc 1 each by Does not apply route in the morning, at noon, and at bedtime. May substitute to any manufacturer covered by patient's insurance.   metFORMIN  1000 MG tablet Commonly known as: GLUCOPHAGE  Take 1,000 mg by mouth 2 (two) times daily with a meal.   oxyCODONE  5 MG immediate release tablet Commonly known as: Roxicodone  Take 1 tablet (5 mg total) by mouth every 6 (six) hours as needed.        Follow-up Information     pcp Follow up in 2 week(s).                 Discharge Exam: Filed Weights   02/19/24 1200  Weight: 101.1 kg   General-appears in no acute distress Heart-S1-S2, regular, no murmur auscultated Lungs-clear to auscultation bilaterally, no wheezing or crackles auscultated Abdomen-soft, nontender, no organomegaly Extremities-no edema in the lower extremities Neuro-alert, oriented x3, no focal deficit noted  Condition at discharge: good  The results of significant diagnostics from this hospitalization (including imaging, microbiology, ancillary and laboratory) are listed below for reference.   Imaging Studies: DG Chest 2 View Result Date: 02/18/2024 CLINICAL DATA:  Chest pain EXAM: CHEST - 2 VIEW COMPARISON:  05/03/2017 FINDINGS: Consolidation in the left lower lobe. No pleural effusion. Normal cardiac size. No pneumothorax IMPRESSION: Left lower lobe consolidation consistent with pneumonia. Electronically Signed   By: Luke Bun M.D.   On: 02/18/2024 20:25    Microbiology: Results for orders placed or performed during the hospital encounter of 02/18/24  Culture, blood (Routine X 2) w Reflex to ID Panel     Status: None (Preliminary result)   Collection Time: 02/19/24  1:48 AM   Specimen: BLOOD  Result Value Ref Range Status   Specimen Description   Final    BLOOD RIGHT ANTECUBITAL Performed at [facility], [address]  [place], [place] [address]    Special Requests   Final    BOTTLES DRAWN AEROBIC AND ANAEROBIC Blood Culture adequate volume Performed at [facility], [address], [place], [place] [address]    Culture   Final    NO GROWTH 3 DAYS Performed at [facility], [address] [place], [place] [address]    Report Status PENDING  Incomplete  Resp panel by RT-PCR (RSV, Flu A&B, Covid) Anterior Nasal Swab     Status: None   Collection Time: 02/19/24  4:42 AM   Specimen: Anterior Nasal Swab  Result Value Ref Range Status   SARS Coronavirus 2 by RT PCR NEGATIVE NEGATIVE Final    Comment: (NOTE) SARS-CoV-2 target nucleic acids are NOT DETECTED.  The SARS-CoV-2 RNA is generally detectable in upper respiratory specimens during the acute phase of infection. The lowest concentration of SARS-CoV-2 viral copies this assay  can detect is 138 copies/mL. A negative result does not preclude SARS-Cov-2 infection and should not be used as the sole basis for treatment or other patient management decisions. A negative result may occur with  improper specimen collection/handling, submission of specimen other than nasopharyngeal swab, presence of viral mutation(s) within the areas targeted by this assay, and inadequate number of viral copies(<138 copies/mL). A negative result must be combined with clinical observations, patient history, and epidemiological information. The expected result is Negative.  Fact Sheet for Patients:  BloggerCourse.com  Fact Sheet for Healthcare Providers:  SeriousBroker.it  This test is no t yet approved or cleared by the [place]  FDA and  has been authorized for detection and/or diagnosis of SARS-CoV-2 by FDA under an Emergency Use Authorization (EUA). This EUA will remain  in effect (meaning this test can be used) for the duration of the COVID-19 declaration under Section 564(b)(1) of the Act,  21 U.S.C.section 360bbb-3(b)(1), unless the authorization is terminated  or revoked sooner.       Influenza A by PCR NEGATIVE NEGATIVE Final   Influenza B by PCR NEGATIVE NEGATIVE Final    Comment: (NOTE) The Xpert Xpress SARS-CoV-2/FLU/RSV plus assay is intended as an aid in the diagnosis of influenza from Nasopharyngeal swab specimens and should not be used as a sole basis for treatment. Nasal washings and aspirates are unacceptable for Xpert Xpress SARS-CoV-2/FLU/RSV testing.  Fact Sheet for Patients: BloggerCourse.com  Fact Sheet for Healthcare Providers: SeriousBroker.it  This test is not yet approved or cleared by the [place]  FDA and has been authorized for detection and/or diagnosis of SARS-CoV-2 by FDA under an Emergency Use Authorization (EUA). This EUA will remain in effect (meaning this test can be used) for the duration of the COVID-19 declaration under Section 564(b)(1) of the Act, 21 U.S.C. section 360bbb-3(b)(1), unless the authorization is terminated or revoked.     Resp Syncytial Virus by PCR NEGATIVE NEGATIVE Final    Comment: (NOTE) Fact Sheet for Patients: BloggerCourse.com  Fact Sheet for Healthcare Providers: SeriousBroker.it  This test is not yet approved or cleared by the [place]  FDA and has been authorized for detection and/or diagnosis of SARS-CoV-2 by FDA under an Emergency Use Authorization (EUA). This EUA will remain in effect (meaning this test can be used) for the duration of the COVID-19 declaration under Section 564(b)(1) of the Act, 21 U.S.C. section 360bbb-3(b)(1), unless the authorization is terminated or revoked.  Performed at [facility], [address], [place], [place] [address]   Culture, blood (Routine X 2) w Reflex to ID Panel     Status: None (Preliminary result)   Collection Time: 02/19/24 12:49  PM   Specimen: BLOOD LEFT ARM  Result Value Ref Range Status   Specimen Description   Final    BLOOD LEFT ARM Performed at [facility], [address] [place], [place] [address]    Special Requests   Final    BOTTLES DRAWN AEROBIC AND ANAEROBIC Blood Culture adequate volume Performed at [facility], [address] [place], [place] [address]    Culture   Final    NO GROWTH 3 DAYS Performed at [facility], [address] [place], [place] [address]    Report Status PENDING  Incomplete    Labs: CBC: Recent Labs  Lab 02/18/24 1952 02/19/24 0442 02/20/24 0555 02/21/24 0314  WBC 29.3* 31.7* 20.8* 11.8*  HGB 15.6 14.5 13.4 13.7  HCT 47.3 43.8 40.5  41.8  MCV 87.4 89.8 87.9 87.8  PLT 226 211 194 209   Basic Metabolic Panel: Recent Labs  Lab 02/18/24 1952 02/19/24 0442 02/20/24 0555  NA 131* 130* 133*  K 3.9 4.0 3.5  CL 99 96* 99  CO2 20* 24 23  GLUCOSE 244* 170* 168*  BUN 26* 29* 32*  CREATININE 1.42* 1.22 1.07  CALCIUM 8.5* 8.2* 8.2*   Liver Function Tests: Recent Labs  Lab 02/19/24 0442  AST 18  ALT 26  ALKPHOS 58  BILITOT 1.4*  PROT 6.7  ALBUMIN 3.2*   CBG: Recent Labs  Lab 02/21/24 0736 02/21/24 1145 02/21/24 1620 02/21/24 2119 02/22/24 0717  GLUCAP 224* 245* 164* 224* 186*    Discharge time spent: greater than 30 minutes.  Signed: Sabas GORMAN Brod, MD [facility] 02/22/2024

## 2024-02-22 NOTE — Progress Notes (Addendum)
 Patient is stable at this time, RR equal and non-labored, A&Ox4.Discharge instructions were reviewed with the patient. He denied questions or concerns at this time. Awaiting meds to bed before discharge. Midline removed, site clean dry and intact. Telemetry monitor 1403 returned to the desk. Provider updated regarding medications. Pt request medications be sent to [facility] on [address] In [place], [place].

## 2024-02-22 NOTE — TOC Transition Note (Signed)
 Transition of Care [facility]) - Discharge Note   Patient Details  Name: Gregory Blake MRN: [id number] Date of Birth: 11/29/1975  Transition of Care [facility]) CM/SW Contact:  Bascom Service, RN Phone Number: 02/22/2024, 10:25 AM   Clinical Narrative: d/c home no CM needs.      Final next level of care: Home/Self Care Barriers to Discharge: No Barriers Identified   Patient Goals and CMS Choice Patient states their goals for this hospitalization and ongoing recovery are:: home          Discharge Placement                       Discharge Plan and Services Additional resources added to the After Visit Summary for     Discharge Planning Services: CM Consult                                 Social Drivers of Health (SDOH) Interventions SDOH Screenings   Food Insecurity: No Food Insecurity (02/20/2024)  Housing: Low Risk  (02/20/2024)  Transportation Needs: No Transportation Needs (02/20/2024)  Utilities: Not At Risk (02/20/2024)  Social Connections: Patient Declined (02/19/2024)  Tobacco Use: Low Risk  (02/18/2024)     Readmission Risk Interventions     No data to display

## 2024-02-24 LAB — CULTURE, BLOOD (ROUTINE X 2)
Culture: NO GROWTH
Special Requests: ADEQUATE
Special Requests: ADEQUATE

## 2024-03-01 ENCOUNTER — Other Ambulatory Visit: Payer: Self-pay

## 2024-03-01 ENCOUNTER — Emergency Department (HOSPITAL_COMMUNITY): Payer: Self-pay

## 2024-03-01 ENCOUNTER — Emergency Department (HOSPITAL_COMMUNITY)
Admission: EM | Admit: 2024-03-01 | Discharge: 2024-03-01 | Disposition: A | Discharge status: Home / Self Care | Payer: Self-pay | Attending: Emergency Medicine | Admitting: Emergency Medicine

## 2024-03-01 ENCOUNTER — Encounter (HOSPITAL_COMMUNITY): Payer: Self-pay

## 2024-03-01 DIAGNOSIS — Z7984 Long term (current) use of oral hypoglycemic drugs: Secondary | ICD-10-CM | POA: Diagnosis not present

## 2024-03-01 DIAGNOSIS — E119 Type 2 diabetes mellitus without complications: Secondary | ICD-10-CM | POA: Insufficient documentation

## 2024-03-01 DIAGNOSIS — S92425A Nondisplaced fracture of distal phalanx of left great toe, initial encounter for closed fracture: Secondary | ICD-10-CM | POA: Insufficient documentation

## 2024-03-01 DIAGNOSIS — W228XXA Striking against or struck by other objects, initial encounter: Secondary | ICD-10-CM | POA: Insufficient documentation

## 2024-03-01 DIAGNOSIS — S99922A Unspecified injury of left foot, initial encounter: Secondary | ICD-10-CM | POA: Diagnosis present

## 2024-03-01 MED ORDER — IBUPROFEN 800 MG PO TABS
800.0000 mg | ORAL_TABLET | Freq: Once | ORAL | Status: AC
  Administered 2024-03-01: 800 mg via ORAL
  Filled 2024-03-01: qty 1

## 2024-03-01 MED ORDER — ACETAMINOPHEN 325 MG PO TABS
650.0000 mg | ORAL_TABLET | Freq: Once | ORAL | Status: AC
  Administered 2024-03-01: 650 mg via ORAL
  Filled 2024-03-01: qty 2

## 2024-03-01 NOTE — ED Notes (Signed)
Ortho called 

## 2024-03-01 NOTE — ED Provider Notes (Signed)
 [place] EMERGENCY DEPARTMENT AT [facility] Provider Note   CSN: [id number] Arrival date & time: 03/01/24  1446     Patient presents with: Toe Injury   Gregory Blake is a 48 y.o. male.   HPI 48 year old male history of diabetes, recent admission for pneumonia, presents today complaining of left toe pain.  He states he was wearing slides and slipped and struck his toe on concrete.  He did not have any other injuries.  This was this morning.  Toe continues to be painful.  There are no wounds present.     Prior to Admission medications   Medication Sig Start Date End Date Taking? Authorizing Provider  Accu-Chek Softclix Lancets lancets Use 1 each in the morning, at noon, and at bedtime to check blood sugars. 02/22/24 03/23/24  Drusilla Sabas RAMAN, MD  albuterol  (PROVENTIL  HFA;VENTOLIN  HFA) 108 (90 Base) MCG/ACT inhaler Inhale 2 puffs into the lungs every 4 (four) hours as needed for wheezing or shortness of breath (cough, shortness of breath or wheezing.). Patient not taking: Reported on 02/18/2024 09/21/17   Mario Million, MD  amLODipine  (NORVASC ) 10 MG tablet Take 1 tablet (10 mg total) by mouth daily. 11/14/17   Gretta Ozell CROME, PA-C  benzonatate  (TESSALON  PERLES) 100 MG capsule Take 2 capsules (200 mg total) by mouth 3 (three) times daily as needed for cough. 02/22/24   Drusilla Sabas RAMAN, MD  Blood Glucose Monitoring Suppl (BLOOD GLUCOSE MONITOR SYSTEM) w/Device KIT Use in the morning, at noon, and at bedtime to check blood sugar. 02/22/24   Drusilla Sabas RAMAN, MD  glimepiride (AMARYL) 2 MG tablet Take 2 mg by mouth in the morning and at bedtime.    [provider]  Glucose Blood (BLOOD GLUCOSE TEST STRIPS) STRP Use in the morning, at noon, and at bedtime to check blood sugar. 02/22/24   Drusilla Sabas RAMAN, MD  guaiFENesin  (MUCINEX ) 600 MG 12 hr tablet Take 1 tablet (600 mg total) by mouth 2 (two) times daily as needed for cough. 02/22/24   Drusilla Sabas RAMAN, MD  Lancet Device MISC 1 each by  Does not apply route in the morning, at noon, and at bedtime. May substitute to any manufacturer covered by patient's insurance. 02/22/24 03/23/24  Drusilla Sabas RAMAN, MD  metFORMIN  (GLUCOPHAGE ) 1000 MG tablet Take 1,000 mg by mouth 2 (two) times daily with a meal.    [provider]  oxyCODONE  (ROXICODONE ) 5 MG immediate release tablet Take 1 tablet (5 mg total) by mouth every 6 (six) hours as needed. 02/22/24 02/21/25  Drusilla Sabas RAMAN, MD    Allergies: Iodine and Shellfish allergy    Review of Systems  Updated Vital Signs BP (!) 151/91 (BP Location: Left Arm)   Pulse (!) 102   Temp 98.7 F (37.1 C) (Oral)   Resp 16   Wt 97.5 kg   SpO2 98%   BMI 28.37 kg/m   Physical Exam Vitals and nursing note reviewed.  Constitutional:      Appearance: He is well-developed.  HENT:     Head: Normocephalic and atraumatic.     Right Ear: External ear normal.     Left Ear: External ear normal.     Nose: Nose normal.  Eyes:     Extraocular Movements: Extraocular movements intact.  Neck:     Trachea: No tracheal deviation.  Pulmonary:     Effort: Pulmonary effort is normal.  Musculoskeletal:        General: Normal range of  motion.     Comments: Left great toe with some tenderness over distal phalanx.  No wounds or obvious deformity Sensation and pulses intact No pain or injury proximal to toe.  Nailbed is intact.  Skin:    General: Skin is warm and dry.  Neurological:     Mental Status: He is alert and oriented to person, place, and time.  Psychiatric:        Mood and Affect: Mood normal.        Behavior: Behavior normal.     (all labs ordered are listed, but only abnormal results are displayed) Labs Reviewed - No data to display  EKG: None  Radiology: No results found.   Procedures   Medications Ordered in the ED  ibuprofen (ADVIL) tablet 800 mg (has no administration in time range)  acetaminophen  (TYLENOL ) tablet 650 mg (has no administration in time range)                                     Medical Decision Making Risk OTC drugs. Prescription drug management.   Left toe x-Charnelle Bergeman obtained with fracture of the proximal aspect of the distal phalanx. Plan crutches, cold therapy, elevation, acetaminophen .  Patient advised regarding precautions given his history of diabetes and for close follow-up.  Patient voices understanding.    Final diagnoses:  Closed nondisplaced fracture of distal phalanx of left great toe, initial encounter    ED Discharge Orders     None          Levander Houston, MD 03/01/24 1520

## 2024-03-01 NOTE — Progress Notes (Signed)
 Orthopedic Tech Progress Note Patient Details:  Gregory Blake [birth year] [id number]  Ortho Devices Type of Ortho Device: Buddy tape, Postop shoe/boot, Crutches Ortho Device/Splint Location: left buddy tape toes. left post op shoe. crutches sized and instructed on use Ortho Device/Splint Interventions: Ordered, Application, Adjustment   Post Interventions Patient Tolerated: Well, Ambulated well Instructions Provided: Adjustment of device, Care of device  [name] 03/01/2024, 4:12 PM

## 2024-03-01 NOTE — ED Triage Notes (Signed)
 POV/ in WC/ left great toe injury this am/ toe is bruised and swollen/ took oxycodone  PTA/ no relief

## 2024-03-16 ENCOUNTER — Other Ambulatory Visit: Payer: Self-pay

## 2024-03-16 ENCOUNTER — Emergency Department (HOSPITAL_COMMUNITY)

## 2024-03-16 ENCOUNTER — Emergency Department (HOSPITAL_COMMUNITY)
Admission: EM | Admit: 2024-03-16 | Discharge: 2024-03-16 | Disposition: A | Discharge status: Home / Self Care | Attending: Emergency Medicine | Admitting: Emergency Medicine

## 2024-03-16 ENCOUNTER — Encounter (HOSPITAL_COMMUNITY): Payer: Self-pay | Admitting: Emergency Medicine

## 2024-03-16 DIAGNOSIS — F17200 Nicotine dependence, unspecified, uncomplicated: Secondary | ICD-10-CM | POA: Insufficient documentation

## 2024-03-16 DIAGNOSIS — Z79899 Other long term (current) drug therapy: Secondary | ICD-10-CM | POA: Diagnosis not present

## 2024-03-16 DIAGNOSIS — R0781 Pleurodynia: Secondary | ICD-10-CM | POA: Diagnosis present

## 2024-03-16 DIAGNOSIS — J9811 Atelectasis: Secondary | ICD-10-CM | POA: Diagnosis not present

## 2024-03-16 DIAGNOSIS — I10 Essential (primary) hypertension: Secondary | ICD-10-CM | POA: Insufficient documentation

## 2024-03-16 DIAGNOSIS — Z7984 Long term (current) use of oral hypoglycemic drugs: Secondary | ICD-10-CM | POA: Diagnosis not present

## 2024-03-16 DIAGNOSIS — E119 Type 2 diabetes mellitus without complications: Secondary | ICD-10-CM | POA: Diagnosis not present

## 2024-03-16 LAB — BASIC METABOLIC PANEL WITH GFR
Anion gap: 9 (ref 5–15)
BUN: 18 mg/dL (ref 6–20)
CO2: 24 mmol/L (ref 22–32)
Calcium: 9.2 mg/dL (ref 8.9–10.3)
Chloride: 102 mmol/L (ref 98–111)
Creatinine, Ser: 1.05 mg/dL (ref 0.61–1.24)
GFR, Estimated: >60 mL/min (ref >60)
Glucose, Bld: 304 mg/dL — ABNORMAL HIGH (ref 70–99)
Potassium: 3.8 mmol/L (ref 3.5–5.1)
Sodium: 135 mmol/L (ref 135–145)

## 2024-03-16 LAB — CBC WITH DIFFERENTIAL/PLATELET
Abs Immature Granulocytes: 0.01 K/uL (ref 0.00–0.07)
Basophils Absolute: 0 K/uL (ref 0.0–0.1)
Basophils Relative: 0 %
Eosinophils Absolute: 0.5 K/uL (ref 0.0–0.5)
Eosinophils Relative: 7 %
HCT: 41.4 % (ref 39.0–52.0)
Hemoglobin: 13.5 g/dL (ref 13.0–17.0)
Immature Granulocytes: 0 %
Lymphocytes Relative: 31 %
Lymphs Abs: 2.2 K/uL (ref 0.7–4.0)
MCH: 28.8 pg (ref 26.0–34.0)
MCHC: 32.6 g/dL (ref 30.0–36.0)
MCV: 88.3 fL (ref 80.0–100.0)
Monocytes Absolute: 0.8 K/uL (ref 0.1–1.0)
Monocytes Relative: 11 %
Neutro Abs: 3.6 K/uL (ref 1.7–7.7)
Neutrophils Relative %: 51 %
Platelets: 290 K/uL (ref 150–400)
RBC: 4.69 MIL/uL (ref 4.22–5.81)
RDW: 13.3 % (ref 11.5–15.5)
WBC: 7.1 K/uL (ref 4.0–10.5)
nRBC: 0 % (ref 0.0–0.2)

## 2024-03-16 LAB — D-DIMER, QUANTITATIVE: D-Dimer, Quant: 0.69 ug{FEU}/mL — ABNORMAL HIGH (ref 0.00–0.50)

## 2024-03-16 MED ORDER — DOXYCYCLINE HYCLATE 100 MG PO CAPS: 100.0000 mg | ORAL_CAPSULE | Freq: Two times a day (BID) | ORAL | 0 refills | Status: AC

## 2024-03-16 MED ORDER — IOHEXOL 350 MG/ML SOLN
75.0000 mL | Freq: Once | INTRAVENOUS | Status: AC | PRN
  Administered 2024-03-16: 75 mL via INTRAVENOUS

## 2024-03-16 MED ORDER — IBUPROFEN 600 MG PO TABS: 600.0000 mg | ORAL_TABLET | Freq: Four times a day (QID) | ORAL | 0 refills | Status: DC | PRN

## 2024-03-16 MED ORDER — DOXYCYCLINE HYCLATE 100 MG PO CAPS: 100.0000 mg | ORAL_CAPSULE | Freq: Two times a day (BID) | ORAL | 0 refills | Status: DC

## 2024-03-16 MED ORDER — IBUPROFEN 600 MG PO TABS: 600.0000 mg | ORAL_TABLET | Freq: Four times a day (QID) | ORAL | 0 refills | Status: AC | PRN

## 2024-03-16 MED ORDER — IBUPROFEN 200 MG PO TABS
600.0000 mg | ORAL_TABLET | Freq: Once | ORAL | Status: AC
  Administered 2024-03-16: 600 mg via ORAL
  Filled 2024-03-16: qty 3

## 2024-03-16 NOTE — Discharge Instructions (Signed)
 You were seen today for cough and chest pain.  You have evidence of micro collapse of the lung on the left which is likely causing your pain.  There is potential that you still have some residual pneumonia.  Will give an additional course of antibiotics in case this is a component.  Use the incentive spirometer 4 times a day to make sure that you are taking big deep breaths.  Take ibuprofen  as needed for pain.

## 2024-03-16 NOTE — ED Triage Notes (Signed)
 Pt in with cough and L anterior/lateral rib pain worsened over the past week. Pt had recent PNA admission 6/21 and finished his course of abx. States he has clear productive sputum. Denies any fevers

## 2024-03-16 NOTE — ED Notes (Signed)
 Pt no answer from lobby x1.

## 2024-03-16 NOTE — ED Provider Notes (Signed)
 Gregory Blake EMERGENCY DEPARTMENT AT [facility] Provider Note   CSN: [id number] Arrival date & time: 03/16/24  0247     Patient presents with: Cough and Rib Pain   Gregory Blake is a 48 y.o. male.  {Add pertinent medical, surgical, social history, OB history to HPI:32947} HPI     This is a 48 year old male who presents with cough and chest pain.  Patient reports that he has anterior and left-sided chest pain that is worse with cough and some movements.  He describes it as sharp.  It is worse with deep breathing as well.  Recently finished a course of antibiotics for pneumonia.  Reports persistent productive cough but no fevers.  Denies lower extremity swelling or history of blood clots.  Continues to smoke.  Prior to Admission medications   Medication Sig Start Date End Date Taking? Authorizing Provider  Accu-Chek Softclix Lancets lancets Use 1 each in the morning, at noon, and at bedtime to check blood sugars. 02/22/24 03/23/24  Drusilla Sabas RAMAN, MD  albuterol  (PROVENTIL  HFA;VENTOLIN  HFA) 108 (90 Base) MCG/ACT inhaler Inhale 2 puffs into the lungs every 4 (four) hours as needed for wheezing or shortness of breath (cough, shortness of breath or wheezing.). Patient not taking: Reported on 02/18/2024 09/21/17   Mario Million, MD  amLODipine  (NORVASC ) 10 MG tablet Take 1 tablet (10 mg total) by mouth daily. 11/14/17   Gretta Ozell CROME, PA-C  benzonatate  (TESSALON  PERLES) 100 MG capsule Take 2 capsules (200 mg total) by mouth 3 (three) times daily as needed for cough. 02/22/24   Drusilla Sabas RAMAN, MD  Blood Glucose Monitoring Suppl (BLOOD GLUCOSE MONITOR SYSTEM) w/Device KIT Use in the morning, at noon, and at bedtime to check blood sugar. 02/22/24   Drusilla Sabas RAMAN, MD  glimepiride (AMARYL) 2 MG tablet Take 2 mg by mouth in the morning and at bedtime.    [provider]  Glucose Blood (BLOOD GLUCOSE TEST STRIPS) STRP Use in the morning, at noon, and at bedtime to check blood sugar.  02/22/24   Drusilla Sabas RAMAN, MD  guaiFENesin  (MUCINEX ) 600 MG 12 hr tablet Take 1 tablet (600 mg total) by mouth 2 (two) times daily as needed for cough. 02/22/24   Drusilla Sabas RAMAN, MD  Lancet Device MISC 1 each by Does not apply route in the morning, at noon, and at bedtime. May substitute to any manufacturer covered by patient's insurance. 02/22/24 03/23/24  Drusilla Sabas RAMAN, MD  metFORMIN  (GLUCOPHAGE ) 1000 MG tablet Take 1,000 mg by mouth 2 (two) times daily with a meal.    [provider]  oxyCODONE  (ROXICODONE ) 5 MG immediate release tablet Take 1 tablet (5 mg total) by mouth every 6 (six) hours as needed. 02/22/24 02/21/25  Drusilla Sabas RAMAN, MD    Allergies: Iodine and Shellfish allergy    Review of Systems  Constitutional:  Negative for fever.  Respiratory:  Positive for cough. Negative for shortness of breath.   Cardiovascular:  Positive for chest pain. Negative for leg swelling.  All other systems reviewed and are negative.   Updated Vital Signs BP (!) 148/97 (BP Location: Left Arm)   Pulse (!) 105   Temp 98.2 F (36.8 C) (Oral)   Resp 18   Wt 97.5 kg   SpO2 99%   BMI 28.37 kg/m   Physical Exam Vitals and nursing note reviewed.  Constitutional:      Appearance: He is well-developed. He is not ill-appearing.  HENT:  Head: Normocephalic and atraumatic.  Eyes:     Pupils: Pupils are equal, round, and reactive to light.  Cardiovascular:     Rate and Rhythm: Normal rate and regular rhythm.     Heart sounds: Normal heart sounds. No murmur heard. Pulmonary:     Effort: Pulmonary effort is normal. No respiratory distress.     Breath sounds: No wheezing.     Comments: Occasional wheeze Chest:     Chest wall: No tenderness.  Abdominal:     General: Bowel sounds are normal.     Palpations: Abdomen is soft.     Tenderness: There is no abdominal tenderness. There is no rebound.  Musculoskeletal:     Cervical back: Neck supple.  Lymphadenopathy:     Cervical: No cervical  adenopathy.  Skin:    General: Skin is warm and dry.  Neurological:     Mental Status: He is alert and oriented to person, place, and time.  Psychiatric:        Mood and Affect: Mood normal.     (all labs ordered are listed, but only abnormal results are displayed) Labs Reviewed  CBC WITH DIFFERENTIAL/PLATELET  BASIC METABOLIC PANEL WITH GFR  D-DIMER, QUANTITATIVE    EKG: None  Radiology: No results found.  {Document cardiac monitor, telemetry assessment procedure when appropriate:32947} Procedures   Medications Ordered in the ED - No data to display    {Click here for ABCD2, HEART and other calculators REFRESH Note before signing:1}                              Medical Decision Making Amount and/or Complexity of Data Reviewed Labs: ordered. Radiology: ordered.   ***  {Document critical care time when appropriate  Document review of labs and clinical decision tools ie CHADS2VASC2, etc  Document your independent review of radiology images and any outside records  Document your discussion with family members, caretakers and with consultants  Document social determinants of health affecting pt's care  Document your decision making why or why not admission, treatments were needed:32947:::1}   Final diagnoses:  None    ED Discharge Orders     None
